# Patient Record
Sex: Female | Born: 1965 | Race: White | Hispanic: No | Smoking: Never smoker
Health system: Southern US, Community
[De-identification: ages and names within clinical notes are randomized; demographics above are authoritative.]

## PROBLEM LIST (undated history)

## (undated) DIAGNOSIS — E0590 Thyrotoxicosis, unspecified without thyrotoxic crisis or storm: Secondary | ICD-10-CM

## (undated) DIAGNOSIS — E079 Disorder of thyroid, unspecified: Secondary | ICD-10-CM

## (undated) DIAGNOSIS — Z8719 Personal history of other diseases of the digestive system: Secondary | ICD-10-CM

## (undated) DIAGNOSIS — I499 Cardiac arrhythmia, unspecified: Secondary | ICD-10-CM

## (undated) DIAGNOSIS — K219 Gastro-esophageal reflux disease without esophagitis: Secondary | ICD-10-CM

## (undated) HISTORY — DX: Personal history of other diseases of the digestive system: Z87.19

## (undated) HISTORY — DX: Disorder of thyroid, unspecified: E07.9

## (undated) HISTORY — DX: Gastro-esophageal reflux disease without esophagitis: K21.9

---

## 2003-12-16 DIAGNOSIS — E0590 Thyrotoxicosis, unspecified without thyrotoxic crisis or storm: Secondary | ICD-10-CM

## 2003-12-16 HISTORY — DX: Thyrotoxicosis, unspecified without thyrotoxic crisis or storm: E05.90

## 2008-11-23 ENCOUNTER — Other Ambulatory Visit: Admission: RE | Admit: 2008-11-23 | Discharge: 2008-11-23 | Payer: Self-pay | Admitting: Family Medicine

## 2009-05-15 ENCOUNTER — Ambulatory Visit (HOSPITAL_COMMUNITY): Admission: RE | Admit: 2009-05-15 | Discharge: 2009-05-15 | Payer: Self-pay | Admitting: Family Medicine

## 2011-11-24 ENCOUNTER — Other Ambulatory Visit (HOSPITAL_COMMUNITY): Payer: Self-pay | Admitting: Family Medicine

## 2011-11-24 DIAGNOSIS — Z1231 Encounter for screening mammogram for malignant neoplasm of breast: Secondary | ICD-10-CM

## 2012-01-01 ENCOUNTER — Ambulatory Visit (HOSPITAL_COMMUNITY): Payer: Self-pay

## 2012-01-16 ENCOUNTER — Ambulatory Visit (HOSPITAL_COMMUNITY)
Admission: RE | Admit: 2012-01-16 | Discharge: 2012-01-16 | Disposition: A | Discharge status: Home / Self Care | Payer: PRIVATE HEALTH INSURANCE | Source: Ambulatory Visit | Attending: Family Medicine | Admitting: Family Medicine

## 2012-01-16 DIAGNOSIS — Z1231 Encounter for screening mammogram for malignant neoplasm of breast: Secondary | ICD-10-CM | POA: Insufficient documentation

## 2012-02-12 ENCOUNTER — Other Ambulatory Visit (HOSPITAL_COMMUNITY)
Admission: RE | Admit: 2012-02-12 | Discharge: 2012-02-12 | Disposition: A | Discharge status: Home / Self Care | Payer: PRIVATE HEALTH INSURANCE | Source: Ambulatory Visit | Attending: Obstetrics and Gynecology | Admitting: Obstetrics and Gynecology

## 2012-02-12 ENCOUNTER — Other Ambulatory Visit: Payer: Self-pay | Admitting: Nurse Practitioner

## 2012-02-12 DIAGNOSIS — Z1159 Encounter for screening for other viral diseases: Secondary | ICD-10-CM | POA: Insufficient documentation

## 2012-02-12 DIAGNOSIS — Z01419 Encounter for gynecological examination (general) (routine) without abnormal findings: Secondary | ICD-10-CM | POA: Insufficient documentation

## 2013-03-22 ENCOUNTER — Other Ambulatory Visit (HOSPITAL_COMMUNITY): Payer: Self-pay | Admitting: Family Medicine

## 2013-03-22 DIAGNOSIS — Z1231 Encounter for screening mammogram for malignant neoplasm of breast: Secondary | ICD-10-CM

## 2013-03-31 ENCOUNTER — Ambulatory Visit (HOSPITAL_COMMUNITY)
Admission: RE | Admit: 2013-03-31 | Discharge: 2013-03-31 | Disposition: A | Discharge status: Home / Self Care | Payer: PRIVATE HEALTH INSURANCE | Source: Ambulatory Visit | Attending: Family Medicine | Admitting: Family Medicine

## 2013-03-31 DIAGNOSIS — Z1231 Encounter for screening mammogram for malignant neoplasm of breast: Secondary | ICD-10-CM | POA: Insufficient documentation

## 2013-12-09 ENCOUNTER — Other Ambulatory Visit (HOSPITAL_COMMUNITY)
Admission: RE | Admit: 2013-12-09 | Discharge: 2013-12-09 | Disposition: A | Discharge status: Home / Self Care | Payer: PRIVATE HEALTH INSURANCE | Source: Ambulatory Visit | Attending: Obstetrics & Gynecology | Admitting: Obstetrics & Gynecology

## 2013-12-09 ENCOUNTER — Other Ambulatory Visit: Payer: Self-pay | Admitting: Obstetrics & Gynecology

## 2013-12-09 DIAGNOSIS — Z01419 Encounter for gynecological examination (general) (routine) without abnormal findings: Secondary | ICD-10-CM | POA: Insufficient documentation

## 2015-07-04 ENCOUNTER — Other Ambulatory Visit (HOSPITAL_COMMUNITY): Payer: Self-pay | Admitting: Family Medicine

## 2015-07-04 DIAGNOSIS — Z1231 Encounter for screening mammogram for malignant neoplasm of breast: Secondary | ICD-10-CM

## 2015-07-12 ENCOUNTER — Ambulatory Visit (HOSPITAL_COMMUNITY)
Admission: RE | Admit: 2015-07-12 | Discharge: 2015-07-12 | Disposition: A | Discharge status: Home / Self Care | Payer: BLUE CROSS/BLUE SHIELD | Source: Ambulatory Visit | Attending: Family Medicine | Admitting: Family Medicine

## 2015-07-12 DIAGNOSIS — Z1231 Encounter for screening mammogram for malignant neoplasm of breast: Secondary | ICD-10-CM | POA: Diagnosis not present

## 2015-07-19 ENCOUNTER — Other Ambulatory Visit: Payer: Self-pay | Admitting: Obstetrics & Gynecology

## 2015-09-13 ENCOUNTER — Encounter (INDEPENDENT_AMBULATORY_CARE_PROVIDER_SITE_OTHER): Payer: Self-pay

## 2015-09-13 ENCOUNTER — Ambulatory Visit: Payer: PRIVATE HEALTH INSURANCE | Admitting: Internal Medicine

## 2015-09-13 ENCOUNTER — Ambulatory Visit (INDEPENDENT_AMBULATORY_CARE_PROVIDER_SITE_OTHER): Payer: BLUE CROSS/BLUE SHIELD | Admitting: Internal Medicine

## 2015-09-13 ENCOUNTER — Encounter: Payer: Self-pay | Admitting: Internal Medicine

## 2015-09-13 VITALS — BP 124/68 | HR 68 | Temp 98.1°F | Ht 65.33 in | Wt 166.0 lb

## 2015-09-13 DIAGNOSIS — K219 Gastro-esophageal reflux disease without esophagitis: Secondary | ICD-10-CM

## 2015-09-13 DIAGNOSIS — R21 Rash and other nonspecific skin eruption: Secondary | ICD-10-CM

## 2015-09-13 DIAGNOSIS — R002 Palpitations: Secondary | ICD-10-CM | POA: Diagnosis not present

## 2015-09-13 DIAGNOSIS — Z8639 Personal history of other endocrine, nutritional and metabolic disease: Secondary | ICD-10-CM | POA: Insufficient documentation

## 2015-09-13 NOTE — Assessment & Plan Note (Signed)
No intervention needed Will continue to monitor

## 2015-09-13 NOTE — Progress Notes (Signed)
HPI  Pt presents to the clinic today to establish care and for management of the conditions listed below.  Skin rashes: Takes Zyrtec daily to prevent this.  GERD: This is occuring more frequently, almost daily. It is triggered by fried foods. She will take Tums if it is really severe. She does have a hiatal hernia.  Hyperthyroid: She went into a thyroid storm in 2005. She was on PTU for a year. She has not had any recurrent issues.  She does feel like she is having PVC's. This has been going on for the last month. She is not sure if it is related to stress. She reports she is going through a separation. She denies anxiety or depression. She has been on Metoprolol in the past when she had hyperthyroidism.  Flu: 08/2014 Tetanus: within the last 10 years Pap Smear: 2014 Mammogram: 06/2015 Colonoscopy: never Vision Screening: yearly Dentist: yearly 06/2015 Past Medical History  Diagnosis Date  . GERD (gastroesophageal reflux disease)   . H/O hiatal hernia   . Thyroid disease     Dx 2005    Current Outpatient Prescriptions  Medication Sig Dispense Refill  . cetirizine (ZYRTEC) 10 MG tablet Take 10 mg by mouth daily.     No current facility-administered medications for this visit.    No Known Allergies  Family History  Problem Relation Age of Onset  . Breast cancer Mother   . Kidney cancer Maternal Grandmother   . Heart disease Maternal Grandfather   . Alzheimer's disease Maternal Grandfather     Social History   Social History  . Marital Status: Married    Spouse Name: N/A  . Number of Children: N/A  . Years of Education: N/A   Occupational History  . Not on file.   Social History Main Topics  . Smoking status: Never Smoker   . Smokeless tobacco: Never Used  . Alcohol Use: 0.0 oz/week    0 Standard drinks or equivalent per week     Comment: occasional wine  . Drug Use: Not on file  . Sexual Activity: Not on file   Other Topics Concern  . Not on file   Social  History Narrative  . No narrative on file    ROS:  Constitutional: Denies fever, malaise, fatigue, headache or abrupt weight changes.  HEENT: Denies eye pain, eye redness, ear pain, ringing in the ears, wax buildup, runny nose, nasal congestion, bloody nose, or sore throat. Respiratory: Denies difficulty breathing, shortness of breath, cough or sputum production.   Cardiovascular: Pt reports palpitations. Denies chest pain, chest tightness, or swelling in the hands or feet.  Skin: Denies redness, rashes, lesions or ulcercations.  Neurological: Denies dizziness, difficulty with memory, difficulty with speech or problems with balance and coordination.  Psych: Denies anxiety, depression, SI/HI.  No other specific complaints in a complete review of systems (except as listed in HPI above).  PE:  BP 124/68 mmHg  Pulse 68  Temp(Src) 98.1 F (36.7 C) (Oral)  Ht 5' 5.33" (1.659 m)  Wt 166 lb (75.297 kg)  BMI 27.36 kg/m2  SpO2 98%  LMP 08/21/2015 Wt Readings from Last 3 Encounters:  09/13/15 166 lb (75.297 kg)    General: Appears her stated age, well developed, well nourished in NAD. Neck: Neck supple, trachea midline. No masses, lumps or thyromegaly present.  Cardiovascular: Normal rate with irregular rhythm. No murmur, rubs or gallops noted.  Pulmonary/Chest: Normal effort and positive vesicular breath sounds. No respiratory distress. No wheezes, rales  or ronchi noted.  Neurological: Alert and oriented.  Psychiatric: Mood and affect normal. Behavior is normal. Judgment and thought content normal.     Assessment and Plan:  Palpitations:  ECG today- Will check CMET and TSH today as well  Make an appt for your annual exam

## 2015-09-13 NOTE — Progress Notes (Signed)
Pre visit review using our clinic review tool, if applicable. No additional management support is needed unless otherwise documented below in the visit note. 

## 2015-09-13 NOTE — Patient Instructions (Signed)

## 2015-09-13 NOTE — Assessment & Plan Note (Signed)
Continue Zyrtec daily 

## 2015-09-13 NOTE — Assessment & Plan Note (Signed)
Discussed trying Prilosec- she has tried in the past and it caused diarrhea She wants to see if this improves with less stress Continue Tums OTC

## 2015-09-14 LAB — CBC
HCT: 42.5 % (ref 36.0–46.0)
Hemoglobin: 14.4 g/dL (ref 12.0–15.0)
MCHC: 33.9 g/dL (ref 30.0–36.0)
MCV: 96.9 fl (ref 78.0–100.0)
Platelets: 219 K/uL (ref 150.0–400.0)
RBC: 4.38 Mil/uL (ref 3.87–5.11)
RDW: 12.6 % (ref 11.5–15.5)
WBC: 8.6 K/uL (ref 4.0–10.5)

## 2015-09-14 LAB — COMPREHENSIVE METABOLIC PANEL
ALK PHOS: 56 U/L (ref 39–117)
ALT: 11 U/L (ref 0–35)
AST: 14 U/L (ref 0–37)
Albumin: 4.2 g/dL (ref 3.5–5.2)
BUN: 12 mg/dL (ref 6–23)
CO2: 29 meq/L (ref 19–32)
Calcium: 9.6 mg/dL (ref 8.4–10.5)
Chloride: 105 mEq/L (ref 96–112)
Creatinine, Ser: 0.77 mg/dL (ref 0.40–1.20)
GFR: 84.31 mL/min (ref >60.00)
GLUCOSE: 73 mg/dL (ref 70–99)
POTASSIUM: 4.2 meq/L (ref 3.5–5.1)
Sodium: 140 mEq/L (ref 135–145)
Total Bilirubin: 0.3 mg/dL (ref 0.2–1.2)
Total Protein: 6.9 g/dL (ref 6.0–8.3)

## 2015-09-14 LAB — TSH: TSH: 0.91 u[IU]/mL (ref 0.35–4.50)

## 2015-11-14 ENCOUNTER — Encounter: Payer: Self-pay | Admitting: Internal Medicine

## 2015-12-19 NOTE — H&P (Signed)
50yo G3P3 who presents for follow up regarding AUB. Pt has called in due to irregular bleeding that has continued with Mirena, TVUS peformed for further assessment of AUB and to check proper location of device. IUD was checked in office and string visualized at proper location. Since placement of devices bleeding is sporadic, she may have a full period for 7-10 days then no bleeding, then light spotting for a week, it constantly varies. In review, the device was placed for AUB as she reported that over the past several months her periods have become heavier. She had reported 5-6 days of "very heavy" bleeding using a super pad and having to change it every 2-3hours and her menses may last for up to two weeks. Following placement, she had a week of a "full blown" period and then has had light spotting requiring a pantyliner since that time. Other than the spotting, she denies pelvic or abdominal pain. No further episodes of heavy bleeding. No other acute complaints.  Work up included the following: EMB performed (8/4)- negative for hyperplasia or malignancy.  Current Medications  Taking   Cetirizine HCl 10 MG Tablet 1 tablet Once a day   Triamcinolone Acetonide 0.1 % Ointment 1 application sparingly to affected area Twice a day   Benadryl(DiphenhydrAMINE HCl) 25 MG Capsule 1 capsule every 6 hrs, Notes: prn   Mirena(Levonorgestrel) , Notes: 08/10/2015   Omeprazole 20 MG Capsule Delayed Release 1 capsule Once a day   Medication List reviewed and reconciled with the patient    Past Medical History  Hyperthyroidism  Esophageal reflux  Dermatitis   Surgical History  No Surgical History documented.   Family History  Father: alive, hypercholesterolemia hypercholesterolemia  Mother: alive, hypercholesterolemia, cancer, breast hypercholesterolemia, cancer, breast, diagnosed with Breast Ca  Maternal Grand Father: deceased  Maternal Grand Mother: deceased, cancer, renal cell cancer, renal cell  Sister  1: alive, hypercholesterolemia hypercholesterolemia   Social History  General:  Tobacco use  cigarettes: Never smoked Tobacco history last updated 09/13/2015 Alcohol: wine, 1-2 per week.  no Recreational drug use.  Exercise: 3-4 x a week.  Marital Status: married.  Children: 3.  Religion: yes, [employer].  Seat belt use: yes.    Gyn History  Sexual activity currently sexually active.  Periods : irregular.  LMP Irregular bleeding throughout Nov 2016. Currently not bleeding for at least 5 days.  Birth control vasectomy, Mirena IUD.  Last pap smear date 12/09/13, negative.  Last mammogram date 07/12/15.  Abnormal pap smear 2000 - normal follow up.  STD GC/Ch 1985.  Denies H/O GYN procedures.    OB History  Pregnancy # 1 live birth, vaginal delivery.  Pregnancy # 2 live birth, vaginal delivery.  Pregnancy # 3 live birth, vaginal delivery.    Allergies  N.K.D.A.   Hospitalization/Major Diagnostic Procedure  No Hospitalization History.   Review of Systems  CONSTITUTIONAL:  no Appetite changes. no Chills. no Fatigue. no Fever.  CARDIOLOGY:  no Chest pain.  RESPIRATORY:  no Shortness of breath. no Cough.  UROLOGY:  no Dysuria. no Urinary frequency. no Urinary incontinence. no Urinary urgency.  GASTROENTEROLOGY:  no Abdominal pain. no Change in bowel habits. no Change in bowel movements.  FEMALE REPRODUCTIVE:  no Abnormal vaginal discharge. no Breast lumps or discharge. no Breast pain. no Hot flashes. no Sexual problems. no Vaginal irritation. no Vaginal itching.  NEUROLOGY:  no Dizziness. no Headache.  PSYCHOLOGY:  no Anxiety. no Depression.  SKIN:  no Rash. no Hives.  HEMATOLOGY/LYMPH:  no Anemia. Using Blood Thinners no.     O: performed in office  GENERAL APPEARANCE well developed, well nourished.  SKIN: warm and dry, no rashes.  LUNGS: regular breathing rate and effort.  FEMALE GENITOURINARY: normal external genitalia, labia - unremarkable, vagina - pink moist  mucosa, no lesions or abnormal discharge, cervix - no discharge or lesions or CMT, strings visualized at cervical os-retroverted uterus.  EXTREMITIES: no edema present.  PSYCH appropriate mood and affect.     Assessments      Labs:  11/22/2015: [place] report reviewed: 11cm retroverted uterus with mulitple fibroids- all submucosal- largest measuring 3.6cm. IUD in proper location. Right ovary with 3.5cm cyst- thin septations-avascular. Left ovary with 5cm cyst with thin septations. No free fluid.   A/P: 50yo G3P3 who presents for LAVH, BS due to abnormal uterine bleeding and ovarian cyst. -NPO -LR @ 125cc/hr -SCDs to OR -Ancef 2g IV to OR -CBC/BMP prior to surgery -Discussed risk/benefit/indications and alternatives including but not limited to risk of bleeding, infection and injury to surrounding organs.  Questions and concerns were addressed and pt wishes to proceed. Reviewed preservation of ovaries if possible.  [name], DO [phone] (pager) [phone] (office)

## 2015-12-26 NOTE — Patient Instructions (Signed)
Your procedure is scheduled on:  Wednesday, Jan. 18, 2017  Enter through the [employer] [facility] at:  9:00 A.M.  Pick up the phone at the desk and dial [phone].  Call this number if you have problems the morning of surgery: [phone].  Remember: Do NOT eat food or drink after:  Midnight Tuesday Take these medicines the morning of surgery with a SIP OF WATER:  Omeprazole  Do NOT wear jewelry (body piercing), metal hair clips/bobby pins, make-up, or nail polish. Do NOT wear lotions, powders, or perfumes.  You may wear deoderant. Do NOT shave for 48 hours prior to surgery. Do NOT bring valuables to the hospital. Contacts, dentures, or bridgework may not be worn into surgery. Leave suitcase in car.  After surgery it may be brought to your room.  For patients admitted to the hospital, checkout time is 11:00 AM the day of discharge.

## 2015-12-27 ENCOUNTER — Encounter (HOSPITAL_COMMUNITY)
Admission: RE | Admit: 2015-12-27 | Discharge: 2015-12-27 | Disposition: A | Discharge status: Home / Self Care | Payer: BLUE CROSS/BLUE SHIELD | Source: Ambulatory Visit | Attending: Obstetrics & Gynecology | Admitting: Obstetrics & Gynecology

## 2015-12-27 ENCOUNTER — Encounter (HOSPITAL_COMMUNITY): Payer: Self-pay

## 2015-12-27 DIAGNOSIS — N259 Disorder resulting from impaired renal tubular function, unspecified: Secondary | ICD-10-CM | POA: Insufficient documentation

## 2015-12-27 DIAGNOSIS — N939 Abnormal uterine and vaginal bleeding, unspecified: Secondary | ICD-10-CM | POA: Insufficient documentation

## 2015-12-27 DIAGNOSIS — Z01812 Encounter for preprocedural laboratory examination: Secondary | ICD-10-CM | POA: Insufficient documentation

## 2015-12-27 DIAGNOSIS — N83209 Unspecified ovarian cyst, unspecified side: Secondary | ICD-10-CM | POA: Diagnosis not present

## 2015-12-27 HISTORY — DX: Cardiac arrhythmia, unspecified: I49.9

## 2015-12-27 HISTORY — DX: Thyrotoxicosis, unspecified without thyrotoxic crisis or storm: E05.90

## 2015-12-27 LAB — COMPREHENSIVE METABOLIC PANEL
ALBUMIN: 4.7 g/dL (ref 3.5–5.0)
ALT: 21 U/L (ref 14–54)
AST: 21 U/L (ref 15–41)
Alkaline Phosphatase: 60 U/L (ref 38–126)
Anion gap: 7 (ref 5–15)
BILIRUBIN TOTAL: 0.8 mg/dL (ref 0.3–1.2)
BUN: 11 mg/dL (ref 6–20)
CO2: 28 mmol/L (ref 22–32)
CREATININE: 0.7 mg/dL (ref 0.44–1.00)
Calcium: 9.7 mg/dL (ref 8.9–10.3)
Chloride: 103 mmol/L (ref 101–111)
GFR calc Af Amer: >60 mL/min (ref >60)
GLUCOSE: 104 mg/dL — AB (ref 65–99)
Potassium: 3.9 mmol/L (ref 3.5–5.1)
Sodium: 138 mmol/L (ref 135–145)
TOTAL PROTEIN: 7.7 g/dL (ref 6.5–8.1)

## 2015-12-27 LAB — CBC
HEMATOCRIT: 41.6 % (ref 36.0–46.0)
HEMOGLOBIN: 14.9 g/dL (ref 12.0–15.0)
MCH: 32.7 pg (ref 26.0–34.0)
MCHC: 35.8 g/dL (ref 30.0–36.0)
MCV: 91.2 fL (ref 78.0–100.0)
Platelets: 266 10*3/uL (ref 150–400)
RBC: 4.56 MIL/uL (ref 3.87–5.11)
RDW: 12 % (ref 11.5–15.5)
WBC: 4.9 10*3/uL (ref 4.0–10.5)

## 2015-12-28 ENCOUNTER — Telehealth: Payer: Self-pay

## 2015-12-28 NOTE — Telephone Encounter (Signed)
Pt is aware.  

## 2015-12-28 NOTE — Telephone Encounter (Signed)
She does not need medical clearance. Ok to proceed with hysterectomy.

## 2015-12-28 NOTE — Telephone Encounter (Signed)
Pt left v/m; pt is scheduled for Hysterectomy and pt was advised by surgeon's office to ck with PCP whether or not she needs medical clearance for surgery. Pt request cb. Last seen 09/13/15 to establish care.Please advise.

## 2016-01-02 ENCOUNTER — Observation Stay (HOSPITAL_COMMUNITY)
Admission: RE | Admit: 2016-01-02 | Discharge: 2016-01-03 | Disposition: A | Discharge status: Home / Self Care | Payer: BLUE CROSS/BLUE SHIELD | Source: Ambulatory Visit | Attending: Obstetrics & Gynecology | Admitting: Obstetrics & Gynecology

## 2016-01-02 ENCOUNTER — Ambulatory Visit (HOSPITAL_COMMUNITY): Payer: BLUE CROSS/BLUE SHIELD | Admitting: Anesthesiology

## 2016-01-02 ENCOUNTER — Encounter (HOSPITAL_COMMUNITY)
Admission: RE | Disposition: A | Discharge status: Home / Self Care | Payer: Self-pay | Source: Ambulatory Visit | Attending: Obstetrics & Gynecology

## 2016-01-02 ENCOUNTER — Encounter (HOSPITAL_COMMUNITY): Payer: Self-pay | Admitting: Anesthesiology

## 2016-01-02 DIAGNOSIS — D259 Leiomyoma of uterus, unspecified: Secondary | ICD-10-CM | POA: Diagnosis not present

## 2016-01-02 DIAGNOSIS — E0590 Thyrotoxicosis, unspecified without thyrotoxic crisis or storm: Secondary | ICD-10-CM | POA: Diagnosis not present

## 2016-01-02 DIAGNOSIS — N938 Other specified abnormal uterine and vaginal bleeding: Secondary | ICD-10-CM | POA: Insufficient documentation

## 2016-01-02 DIAGNOSIS — N838 Other noninflammatory disorders of ovary, fallopian tube and broad ligament: Secondary | ICD-10-CM | POA: Insufficient documentation

## 2016-01-02 DIAGNOSIS — D270 Benign neoplasm of right ovary: Secondary | ICD-10-CM | POA: Insufficient documentation

## 2016-01-02 DIAGNOSIS — K219 Gastro-esophageal reflux disease without esophagitis: Secondary | ICD-10-CM | POA: Diagnosis not present

## 2016-01-02 DIAGNOSIS — Z79899 Other long term (current) drug therapy: Secondary | ICD-10-CM | POA: Insufficient documentation

## 2016-01-02 HISTORY — PX: BILATERAL SALPINGECTOMY: SHX5743

## 2016-01-02 HISTORY — PX: IUD REMOVAL: SHX5392

## 2016-01-02 HISTORY — PX: LAPAROSCOPIC VAGINAL HYSTERECTOMY WITH SALPINGECTOMY: SHX6680

## 2016-01-02 LAB — TYPE AND SCREEN
ABO/RH(D): O POS
Antibody Screen: NEGATIVE

## 2016-01-02 LAB — ABO/RH: ABO/RH(D): O POS

## 2016-01-02 SURGERY — HYSTERECTOMY, VAGINAL, LAPAROSCOPY-ASSISTED, WITH SALPINGECTOMY
Anesthesia: General | Site: Vagina

## 2016-01-02 MED ORDER — FENTANYL CITRATE (PF) 250 MCG/5ML IJ SOLN
INTRAMUSCULAR | Status: AC
  Filled 2016-01-02: qty 5

## 2016-01-02 MED ORDER — OXYCODONE-ACETAMINOPHEN 5-325 MG PO TABS: 1.0000 | ORAL_TABLET | ORAL | Status: DC | PRN

## 2016-01-02 MED ORDER — KETOROLAC TROMETHAMINE 30 MG/ML IJ SOLN: 30.0000 mg | Freq: Four times a day (QID) | INTRAMUSCULAR | Status: DC

## 2016-01-02 MED ORDER — ROCURONIUM BROMIDE 100 MG/10ML IV SOLN
INTRAVENOUS | Status: DC | PRN
  Administered 2016-01-02: 30 mg via INTRAVENOUS
  Administered 2016-01-02: 20 mg via INTRAVENOUS

## 2016-01-02 MED ORDER — KETOROLAC TROMETHAMINE 30 MG/ML IJ SOLN
30.0000 mg | Freq: Four times a day (QID) | INTRAMUSCULAR | Status: DC
  Administered 2016-01-02 – 2016-01-03 (×3): 30 mg via INTRAVENOUS
  Filled 2016-01-02 (×3): qty 1

## 2016-01-02 MED ORDER — ONDANSETRON HCL 4 MG/2ML IJ SOLN: 4.0000 mg | Freq: Four times a day (QID) | INTRAMUSCULAR | Status: DC | PRN

## 2016-01-02 MED ORDER — BUPIVACAINE HCL (PF) 0.25 % IJ SOLN
INTRAMUSCULAR | Status: DC | PRN
  Administered 2016-01-02: 29 mL

## 2016-01-02 MED ORDER — BUPIVACAINE HCL (PF) 0.25 % IJ SOLN
INTRAMUSCULAR | Status: AC
  Filled 2016-01-02: qty 30

## 2016-01-02 MED ORDER — LACTATED RINGERS IV SOLN
INTRAVENOUS | Status: DC
  Administered 2016-01-02: 09:00:00 via INTRAVENOUS

## 2016-01-02 MED ORDER — MIDAZOLAM HCL 2 MG/2ML IJ SOLN
INTRAMUSCULAR | Status: AC
  Filled 2016-01-02: qty 2

## 2016-01-02 MED ORDER — MENTHOL 3 MG MT LOZG: 1.0000 | LOZENGE | OROMUCOSAL | Status: DC | PRN

## 2016-01-02 MED ORDER — ONDANSETRON HCL 4 MG/2ML IJ SOLN
INTRAMUSCULAR | Status: DC | PRN
  Administered 2016-01-02: 4 mg via INTRAVENOUS

## 2016-01-02 MED ORDER — CEFAZOLIN SODIUM-DEXTROSE 2-3 GM-% IV SOLR
INTRAVENOUS | Status: AC
  Filled 2016-01-02: qty 50

## 2016-01-02 MED ORDER — GLYCOPYRROLATE 0.2 MG/ML IJ SOLN
INTRAMUSCULAR | Status: DC | PRN
  Administered 2016-01-02 (×2): 0.1 mg via INTRAVENOUS
  Administered 2016-01-02: 0.4 mg via INTRAVENOUS

## 2016-01-02 MED ORDER — LIDOCAINE-EPINEPHRINE (PF) 1 %-1:200000 IJ SOLN
INTRAMUSCULAR | Status: DC | PRN
  Administered 2016-01-02: 15 mL

## 2016-01-02 MED ORDER — SCOPOLAMINE 1 MG/3DAYS TD PT72
1.0000 | MEDICATED_PATCH | Freq: Once | TRANSDERMAL | Status: DC
  Administered 2016-01-02: 1.5 mg via TRANSDERMAL

## 2016-01-02 MED ORDER — DOCUSATE SODIUM 100 MG PO CAPS
100.0000 mg | ORAL_CAPSULE | Freq: Two times a day (BID) | ORAL | Status: DC
  Administered 2016-01-02 – 2016-01-03 (×2): 100 mg via ORAL
  Filled 2016-01-02 (×2): qty 1

## 2016-01-02 MED ORDER — LACTATED RINGERS IV SOLN
INTRAVENOUS | Status: DC
  Administered 2016-01-02 – 2016-01-03 (×2): via INTRAVENOUS

## 2016-01-02 MED ORDER — DEXAMETHASONE SODIUM PHOSPHATE 4 MG/ML IJ SOLN
INTRAMUSCULAR | Status: AC
  Filled 2016-01-02: qty 1

## 2016-01-02 MED ORDER — GLYCOPYRROLATE 0.2 MG/ML IJ SOLN: INTRAMUSCULAR | Status: DC | PRN

## 2016-01-02 MED ORDER — PROPOFOL 10 MG/ML IV BOLUS
INTRAVENOUS | Status: DC | PRN
  Administered 2016-01-02: 180 mg via INTRAVENOUS

## 2016-01-02 MED ORDER — POLYETHYLENE GLYCOL 3350 17 G PO PACK: 17.0000 g | PACK | Freq: Every day | ORAL | Status: DC | PRN

## 2016-01-02 MED ORDER — FENTANYL CITRATE (PF) 100 MCG/2ML IJ SOLN
INTRAMUSCULAR | Status: DC | PRN
  Administered 2016-01-02 (×2): 100 ug via INTRAVENOUS

## 2016-01-02 MED ORDER — MIDAZOLAM HCL 2 MG/2ML IJ SOLN
INTRAMUSCULAR | Status: DC | PRN
  Administered 2016-01-02: 2 mg via INTRAVENOUS

## 2016-01-02 MED ORDER — EPHEDRINE SULFATE 50 MG/ML IJ SOLN
INTRAMUSCULAR | Status: DC | PRN
  Administered 2016-01-02: 5 mg via INTRAVENOUS

## 2016-01-02 MED ORDER — LIDOCAINE-EPINEPHRINE (PF) 1 %-1:200000 IJ SOLN
INTRAMUSCULAR | Status: AC
  Filled 2016-01-02: qty 30

## 2016-01-02 MED ORDER — PROMETHAZINE HCL 25 MG/ML IJ SOLN: 6.2500 mg | INTRAMUSCULAR | Status: DC | PRN

## 2016-01-02 MED ORDER — LIDOCAINE HCL (CARDIAC) 20 MG/ML IV SOLN
INTRAVENOUS | Status: AC
  Filled 2016-01-02: qty 5

## 2016-01-02 MED ORDER — EPHEDRINE 5 MG/ML INJ
INTRAVENOUS | Status: AC
  Filled 2016-01-02: qty 10

## 2016-01-02 MED ORDER — MEPERIDINE HCL 25 MG/ML IJ SOLN: 6.2500 mg | INTRAMUSCULAR | Status: DC | PRN

## 2016-01-02 MED ORDER — KETOROLAC TROMETHAMINE 30 MG/ML IJ SOLN
INTRAMUSCULAR | Status: DC | PRN
  Administered 2016-01-02: 30 mg via INTRAVENOUS

## 2016-01-02 MED ORDER — HYDROMORPHONE HCL 1 MG/ML IJ SOLN: 0.2500 mg | INTRAMUSCULAR | Status: DC | PRN

## 2016-01-02 MED ORDER — LIDOCAINE HCL (CARDIAC) 20 MG/ML IV SOLN
INTRAVENOUS | Status: DC | PRN
  Administered 2016-01-02: 100 mg via INTRAVENOUS

## 2016-01-02 MED ORDER — KETOROLAC TROMETHAMINE 30 MG/ML IJ SOLN: 30.0000 mg | Freq: Once | INTRAMUSCULAR | Status: DC

## 2016-01-02 MED ORDER — NEOSTIGMINE METHYLSULFATE 10 MG/10ML IV SOLN
INTRAVENOUS | Status: AC
  Filled 2016-01-02: qty 1

## 2016-01-02 MED ORDER — CEFAZOLIN SODIUM-DEXTROSE 2-3 GM-% IV SOLR
2.0000 g | INTRAVENOUS | Status: AC
  Administered 2016-01-02: 2 g via INTRAVENOUS

## 2016-01-02 MED ORDER — LACTATED RINGERS IR SOLN
Status: DC | PRN
  Administered 2016-01-02: 3000 mL

## 2016-01-02 MED ORDER — SIMETHICONE 80 MG PO CHEW: 80.0000 mg | CHEWABLE_TABLET | Freq: Four times a day (QID) | ORAL | Status: DC | PRN

## 2016-01-02 MED ORDER — PROPOFOL 10 MG/ML IV BOLUS
INTRAVENOUS | Status: AC
  Filled 2016-01-02: qty 20

## 2016-01-02 MED ORDER — PANTOPRAZOLE SODIUM 40 MG PO TBEC
40.0000 mg | DELAYED_RELEASE_TABLET | Freq: Every day | ORAL | Status: DC
  Administered 2016-01-03: 40 mg via ORAL
  Filled 2016-01-02: qty 1

## 2016-01-02 MED ORDER — ROCURONIUM BROMIDE 100 MG/10ML IV SOLN
INTRAVENOUS | Status: AC
  Filled 2016-01-02: qty 1

## 2016-01-02 MED ORDER — LACTATED RINGERS IV SOLN
INTRAVENOUS | Status: DC
  Administered 2016-01-02 (×2): via INTRAVENOUS

## 2016-01-02 MED ORDER — SCOPOLAMINE 1 MG/3DAYS TD PT72
MEDICATED_PATCH | TRANSDERMAL | Status: AC
  Administered 2016-01-02: 1.5 mg via TRANSDERMAL
  Filled 2016-01-02: qty 1

## 2016-01-02 MED ORDER — KETOROLAC TROMETHAMINE 30 MG/ML IJ SOLN
INTRAMUSCULAR | Status: AC
  Filled 2016-01-02: qty 1

## 2016-01-02 MED ORDER — GLYCOPYRROLATE 0.2 MG/ML IJ SOLN
INTRAMUSCULAR | Status: AC
  Filled 2016-01-02: qty 3

## 2016-01-02 MED ORDER — NEOSTIGMINE METHYLSULFATE 10 MG/10ML IV SOLN
INTRAVENOUS | Status: DC | PRN
  Administered 2016-01-02: 3 mg via INTRAVENOUS

## 2016-01-02 MED ORDER — DEXAMETHASONE SODIUM PHOSPHATE 10 MG/ML IJ SOLN
INTRAMUSCULAR | Status: DC | PRN
  Administered 2016-01-02: 4 mg via INTRAVENOUS

## 2016-01-02 MED ORDER — ONDANSETRON HCL 4 MG PO TABS: 4.0000 mg | ORAL_TABLET | Freq: Four times a day (QID) | ORAL | Status: DC | PRN

## 2016-01-02 MED ORDER — ONDANSETRON HCL 4 MG/2ML IJ SOLN
INTRAMUSCULAR | Status: AC
  Filled 2016-01-02: qty 2

## 2016-01-02 SURGICAL SUPPLY — 41 items
APPLICATOR ARISTA FLEXITIP XL (MISCELLANEOUS) IMPLANT
CABLE HIGH FREQUENCY MONO STRZ (ELECTRODE) IMPLANT
CLOTH BEACON ORANGE TIMEOUT ST (SAFETY) ×3 IMPLANT
DRSG COVADERM PLUS 2X2 (GAUZE/BANDAGES/DRESSINGS) IMPLANT
DRSG OPSITE POSTOP 3X4 (GAUZE/BANDAGES/DRESSINGS) ×3 IMPLANT
DURAPREP 26ML APPLICATOR (WOUND CARE) ×3 IMPLANT
ELECT LIGASURE SHORT 9 REUSE (ELECTRODE) ×3 IMPLANT
GAUZE PACKING IODOFORM 2 (PACKING) ×3 IMPLANT
GLOVE BIOGEL PI IND STRL 6.5 (GLOVE) ×8 IMPLANT
GLOVE BIOGEL PI IND STRL 7.0 (GLOVE) ×4 IMPLANT
GLOVE BIOGEL PI INDICATOR 6.5 (GLOVE) ×4
GLOVE BIOGEL PI INDICATOR 7.0 (GLOVE) ×2
GLOVE ECLIPSE 6.5 STRL STRAW (GLOVE) ×6 IMPLANT
GOWN STRL REUS W/TWL LRG LVL3 (GOWN DISPOSABLE) ×6 IMPLANT
LEGGING LITHOTOMY PAIR STRL (DRAPES) ×3 IMPLANT
LIQUID BAND (GAUZE/BANDAGES/DRESSINGS) ×3 IMPLANT
NEEDLE INSUFFLATION 120MM (ENDOMECHANICALS) ×3 IMPLANT
PACK LAPAROSCOPY BASIN (CUSTOM PROCEDURE TRAY) ×3 IMPLANT
PACK LAVH (CUSTOM PROCEDURE TRAY) ×3 IMPLANT
PACK ROBOTIC GOWN (GOWN DISPOSABLE) ×3 IMPLANT
PAD OB MATERNITY 4.3X12.25 (PERSONAL CARE ITEMS) ×3 IMPLANT
PAD TRENDELENBURG POSITION (MISCELLANEOUS) ×3 IMPLANT
POUCH SPECIMEN RETRIEVAL 10MM (ENDOMECHANICALS) IMPLANT
SCISSORS LAP 5X35 DISP (ENDOMECHANICALS) IMPLANT
SET IRRIG TUBING LAPAROSCOPIC (IRRIGATION / IRRIGATOR) ×3 IMPLANT
SHEARS HARMONIC ACE PLUS 36CM (ENDOMECHANICALS) ×3 IMPLANT
SLEEVE XCEL OPT CAN 5 100 (ENDOMECHANICALS) ×6 IMPLANT
SPOGE SURGIFLO 8M (HEMOSTASIS)
SPONGE SURGIFLO 8M (HEMOSTASIS) IMPLANT
SUT MON AB 4-0 PS1 27 (SUTURE) ×3 IMPLANT
SUT VIC AB 0 CT1 18XCR BRD8 (SUTURE) ×4 IMPLANT
SUT VIC AB 0 CT1 36 (SUTURE) ×6 IMPLANT
SUT VIC AB 0 CT1 8-18 (SUTURE) ×2
SUT VICRYL 0 TIES 12 18 (SUTURE) ×3 IMPLANT
SUT VICRYL 0 UR6 27IN ABS (SUTURE) IMPLANT
TOWEL OR 17X24 6PK STRL BLUE (TOWEL DISPOSABLE) ×6 IMPLANT
TRAY FOLEY CATH SILVER 14FR (SET/KITS/TRAYS/PACK) ×3 IMPLANT
TROCAR XCEL NON-BLD 11X100MML (ENDOMECHANICALS) IMPLANT
TROCAR XCEL NON-BLD 5MMX100MML (ENDOMECHANICALS) ×3 IMPLANT
WARMER LAPAROSCOPE (MISCELLANEOUS) ×3 IMPLANT
WATER STERILE IRR 1000ML POUR (IV SOLUTION) IMPLANT

## 2016-01-02 NOTE — Op Note (Addendum)
Preoperative Diagnosis: Abnormal uterine bleeding, uterine fibroids, bilateral ovarian cysts Postoperative Diagnosis: same Procedure: Laparoscopy Assisted Vaginal Hysterectomy, Bilateral salpingectomy, right ovarian cystectomy Surgeon: Dr. [name]  Assistant: Dr. Cyndia Skeeters Anesthetic: General  IVF: 1800cc EBL: 200cc  UOP:600cc  Specimens: 1) bilateral tubes with uterus, ovarian cyst, pelvic washings Findings: No free fluid or omental studding appreciated. Normal appearing liver and bowel. Right ovary enlarged with 5cm cyst, left ovary normal,  Bilaterally fallopian tubes normal.  10cm boggy enlarged uterus with fibroids Uterine weight: 294g.   Procedure: The patient was taken to the operating room where general anesthesia was found to be adequate. The patient's abdomen was prepped with ChloraPrep. The perineum and vagina were prepped with multiple layers of Betadine. The patient was sterilely draped. A Foley catheter was placed in the bladder. A weight speculum was placed in the vagina. The IUD was removed.  A Hulka manipulator was placed without difficulty. Gloves were changed and attention was turned to the abdomen. The infraumbilical incision was incised with quarter percent Marcaine. An incision was made in the infraumbilical area and the Veress needle was inserted into the abdominal cavity without difficulty. The saline drop test was performed with opening pressure of 74mmHg. The abdomen was insufflated with CO2.   Under direct visualization, the laparoscopic trocar and the laparoscope were placed. Intraperitoneal placement was confirmed, findings as noted above. Two additional ports were placed in the right and left lower quadrants in the following manner: Each area was injected with quarter percent Marcaine. A small incision was made and a 5 mm trocar was inserted into the abdominal cavity under direct visualization. Pelvic washings were obtained.  Attention was turned  to the left adnexa.  The ureter was identified.   The harmonic was used to dissect, ligate and removed the left fallopian tube. The left utero-ovarian ligament was ligated. The left round ligament was grasped, elevated, fulgurated and divided using the Harmonic. The anterior leaf of the broad ligament was dissected medially and inferiorly to allow dissection of the vesicouterine flap.   Attention was turned to the right adnexa and the right ureter was identified.  Using the harmonic, a small incision was made within the ovarian cyst.  Using gentle traction, the cyst wall was separated from the remaining ovarian tissue.  In a similar fashion to the left, the harmonic was used to ligate and remove the right fallopian tube.  The utero-ovarian ligament was ligated. The right round ligament was clamped, elevated, ligated, and divided with the Harmonic. The vesicouterine fold of peritoneum was incised anteriorly for full dissection of the vesicouterine flap. Attention was then turned vaginally.    The uterine manipulator was removed and a weighted speculum was placed in the posterior vagina. The cervix was injected with half percent Marcaine with epinephrine. The cervix was then circumferentially incised with the bovie and the bladder was dissected off the pubovesical cervical fascia. The posterior cul-de-sac was entered sharply.The same procedure was performed anteriorly. A heany clamp was placed over the uterosacral ligaments bilaterally. These were transected and suture ligated with 0 vicryl. The cardinal ligaments were then clamped bilaterally and transected with the Ligasure. The uterine arteries and broad ligament was then serially ligated with the Ligasure. The uterus, bilateral fallopian tubes and cyst were removed from the operative field and sent to pathology. Adequate hemostasis was seen. The vaginal cuff was closed in a running locked fashion using 0 vicryl. Vaginal packing was placed. Gown and  gloves were changed and  attention was turned to the abdomen.  The pneumoperitoneum was reestablished. The pelvis was irrigated and inspected. Hemostasis was observed.  The lateral 5 mm trochars were removed under direct visualization. The pneumoperitoneum was allowed to escape. The subumbilical trocar was removed. Dermabond was placed over the three trocar sites. The patient tolerated her procedure well. She was awakened from her anesthetic without difficulty and then transported to the recovery room in stable condition.   Dr. Simona Huh assisted during the case as residents are not available to our service and due to the complex nature of the procedure.     [name], DO [phone] (pager) [phone] (office)

## 2016-01-02 NOTE — Transfer of Care (Signed)
Immediate Anesthesia Transfer of Care Note  Patient: Lori Ammirati  Procedure(s) Performed: Procedure(s): LAPAROSCOPIC ASSISTED VAGINAL HYSTERECTOMY  (Bilateral) BILATERAL SALPINGECTOMY (Bilateral) INTRAUTERINE DEVICE (IUD) REMOVAL (N/A)  Patient Location: PACU  Anesthesia Type:General  Level of Consciousness: awake, alert  and oriented  Airway & Oxygen Therapy: Patient Spontanous Breathing and Patient connected to nasal cannula oxygen  Post-op Assessment: Report given to RN, Post -op Vital signs reviewed and stable and Patient moving all extremities  Post vital signs: Reviewed and stable  Last Vitals:  Filed Vitals:   01/02/16 0854  BP: 145/85  Pulse: 64  Temp: 36.8 C  Resp: 20    Complications: No apparent anesthesia complications

## 2016-01-02 NOTE — Interval H&P Note (Signed)
History and Physical Interval Note:  01/02/2016 9:58 AM  Lori Davidson  has presented today for surgery, with the diagnosis of N93.9 AUB N83.209 Ovarian Cyst D25.9 Uterine Fibroid  The various methods of treatment have been discussed with the patient and family. After consideration of risks, benefits and other options for treatment, the patient has consented to  Procedure(s): LAPAROSCOPIC ASSISTED VAGINAL HYSTERECTOMY  (Bilateral) BILATERAL SALPINGECTOMY (Bilateral) as a surgical intervention .  The patient's history has been reviewed, patient examined, no change in status, stable for surgery.  I have reviewed the patient's chart and labs.  Questions were answered to the patient's satisfaction.     [name], M

## 2016-01-02 NOTE — Anesthesia Preprocedure Evaluation (Signed)
Anesthesia Evaluation  Patient identified by MRN, date of birth, ID band Patient awake    Reviewed: Allergy & Precautions, H&P , NPO status , Patient's Chart, lab work & pertinent test results, reviewed documented beta blocker date and time   Airway Mallampati: I  TM Distance: >3 FB Neck ROM: full    Dental no notable dental hx. (+) Teeth Intact   Pulmonary neg pulmonary ROS,    Pulmonary exam normal        Cardiovascular negative cardio ROS Normal cardiovascular exam     Neuro/Psych negative neurological ROS  negative psych ROS   GI/Hepatic Neg liver ROS, GERD  Medicated and Controlled,  Endo/Other    Renal/GU negative Renal ROS     Musculoskeletal   Abdominal Normal abdominal exam  (+)   Peds  Hematology negative hematology ROS (+)   Anesthesia Other Findings   Reproductive/Obstetrics negative OB ROS                             Anesthesia Physical Anesthesia Plan  ASA: II  Anesthesia Plan: General   Post-op Pain Management:    Induction: Intravenous  Airway Management Planned: Oral ETT  Additional Equipment:   Intra-op Plan:   Post-operative Plan: Extubation in OR  Informed Consent: I have reviewed the patients History and Physical, chart, labs and discussed the procedure including the risks, benefits and alternatives for the proposed anesthesia with the patient or authorized representative who has indicated his/her understanding and acceptance.   Dental Advisory Given  Plan Discussed with: CRNA and Surgeon  Anesthesia Plan Comments:         Anesthesia Quick Evaluation

## 2016-01-02 NOTE — Anesthesia Procedure Notes (Signed)
Procedure Name: Intubation Date/Time: 01/02/2016 10:21 AM Performed by: Hewitt Blade Pre-anesthesia Checklist: Patient identified, Emergency Drugs available, Suction available and Patient being monitored Patient Re-evaluated:Patient Re-evaluated prior to inductionOxygen Delivery Method: Circle system utilized Preoxygenation: Pre-oxygenation with 100% oxygen Intubation Type: IV induction Ventilation: Mask ventilation without difficulty Laryngoscope Size: Mac and 3 Grade View: Grade I Tube type: Oral Tube size: 7.0 mm Number of attempts: 1 Airway Equipment and Method: Stylet Placement Confirmation: ETT inserted through vocal cords under direct vision,  positive ETCO2 and breath sounds checked- equal and bilateral Secured at: 21 cm Tube secured with: Tape Dental Injury: Teeth and Oropharynx as per pre-operative assessment

## 2016-01-02 NOTE — Anesthesia Postprocedure Evaluation (Signed)
Anesthesia Post Note  Patient: Keyosha Certain  Procedure(s) Performed: Procedure(s) (LRB): LAPAROSCOPIC ASSISTED VAGINAL HYSTERECTOMY  (Bilateral) BILATERAL SALPINGECTOMY (Bilateral) INTRAUTERINE DEVICE (IUD) REMOVAL (N/A)  Patient location during evaluation: PACU Anesthesia Type: General Level of consciousness: awake Pain management: pain level controlled Vital Signs Assessment: post-procedure vital signs reviewed and stable Respiratory status: spontaneous breathing Cardiovascular status: stable Postop Assessment: no signs of nausea or vomiting Anesthetic complications: no    Last Vitals:  Filed Vitals:   01/02/16 1300 01/02/16 1315  BP: 125/66 121/72  Pulse: 88 93  Temp: 36.9 C   Resp: 18 18    Last Pain:  Filed Vitals:   01/02/16 1318  PainSc: 3                  Cem Kosman JR,JOHN Romey Mathieson

## 2016-01-03 ENCOUNTER — Encounter (HOSPITAL_COMMUNITY): Payer: Self-pay | Admitting: Obstetrics & Gynecology

## 2016-01-03 DIAGNOSIS — D259 Leiomyoma of uterus, unspecified: Secondary | ICD-10-CM | POA: Diagnosis not present

## 2016-01-03 LAB — BASIC METABOLIC PANEL
ANION GAP: 8 (ref 5–15)
BUN: 9 mg/dL (ref 6–20)
CALCIUM: 9.1 mg/dL (ref 8.9–10.3)
CO2: 27 mmol/L (ref 22–32)
CREATININE: 0.58 mg/dL (ref 0.44–1.00)
Chloride: 105 mmol/L (ref 101–111)
GFR calc Af Amer: >60 mL/min (ref >60)
GLUCOSE: 85 mg/dL (ref 65–99)
Potassium: 3.9 mmol/L (ref 3.5–5.1)
Sodium: 140 mmol/L (ref 135–145)

## 2016-01-03 LAB — CBC
HEMATOCRIT: 32.9 % — AB (ref 36.0–46.0)
Hemoglobin: 11.5 g/dL — ABNORMAL LOW (ref 12.0–15.0)
MCH: 32.2 pg (ref 26.0–34.0)
MCHC: 35 g/dL (ref 30.0–36.0)
MCV: 92.2 fL (ref 78.0–100.0)
PLATELETS: 194 10*3/uL (ref 150–400)
RBC: 3.57 MIL/uL — ABNORMAL LOW (ref 3.87–5.11)
RDW: 12 % (ref 11.5–15.5)
WBC: 8.5 10*3/uL (ref 4.0–10.5)

## 2016-01-03 MED ORDER — IBUPROFEN 600 MG PO TABS: 600.0000 mg | ORAL_TABLET | Freq: Four times a day (QID) | ORAL | Status: DC | PRN

## 2016-01-03 MED ORDER — DOCUSATE SODIUM 100 MG PO CAPS: 100.0000 mg | ORAL_CAPSULE | Freq: Two times a day (BID) | ORAL | Status: DC

## 2016-01-03 NOTE — Addendum Note (Signed)
Addendum  created 01/03/16 0910 by Asher Muir, CRNA   Modules edited: Clinical Notes   Clinical Notes:  File: [id number]

## 2016-01-03 NOTE — Progress Notes (Signed)
Discharge instructions provided to patient at bedside.  Activity, medications, follow up appointments, when to call the doctor and community resources discussed.  No questions at this time.  Patient left unit in stable condition with all personal belongings accompanied by staff.  Leighton Roach, RN------------------------

## 2016-01-03 NOTE — Progress Notes (Signed)
Vaginal packing removed as ordered, scant bloody drainage noted on packing. Patient tolerated well. Foley cath removed as ordered, tolerated well.

## 2016-01-03 NOTE — Anesthesia Postprocedure Evaluation (Signed)
Anesthesia Post Note  Patient: Lori Davidson  Procedure(s) Performed: Procedure(s) (LRB): LAPAROSCOPIC ASSISTED VAGINAL HYSTERECTOMY  (Bilateral) BILATERAL SALPINGECTOMY (Bilateral) INTRAUTERINE DEVICE (IUD) REMOVAL (N/A)  Patient location during evaluation: [facility] Anesthesia Type: General Level of consciousness: awake Pain management: satisfactory to patient Vital Signs Assessment: post-procedure vital signs reviewed and stable Respiratory status: spontaneous breathing Cardiovascular status: stable Anesthetic complications: no    Last Vitals:  Filed Vitals:   01/03/16 0114 01/03/16 0523  BP: 91/53 97/58  Pulse: 78 69  Temp: 36.9 C 36.6 C  Resp: 16 16    Last Pain:  Filed Vitals:   01/03/16 0545  PainSc: 0-No pain                 Oliva Montecalvo

## 2016-01-03 NOTE — Progress Notes (Signed)
Postoperative Note Day # 1  S:  Patient resting comfortable in bed.  Pain controlled.  Tolerating general. No flatus, no BM.  Packing removed this am, scant blood noted.  Ambulating without difficulty.  She denies n/v/f/c, SOB, or CP.  O: Temp:  [97.8 F (36.6 C)-98.7 F (37.1 C)] 97.8 F (36.6 C) (01/19 0523) Pulse Rate:  [64-97] 69 (01/19 0523) Resp:  [16-20] 16 (01/19 0523) BP: (91-145)/(48-85) 97/58 mmHg (01/19 0523) SpO2:  [97 %-100 %] 100 % (01/19 0523) Weight:  [73.936 kg (163 lb)] 73.936 kg (163 lb) (01/18 1400)  UOP: 1800cc/8hr  Gen: A&Ox3, NAD CV: RRR, no MRG Resp: CTAB Abdomen: soft, NT, ND +BS Incision: trocar sites- C/D/I with dermabond and honeycomb over umbilical incision Ext: No edema, no calf tenderness bilaterally, SCDs in place  Labs:  CBC    Component Value Date/Time   WBC 8.5 01/03/2016 0515   RBC 3.57* 01/03/2016 0515   HGB 11.5* 01/03/2016 0515   HCT 32.9* 01/03/2016 0515   PLT 194 01/03/2016 0515   MCV 92.2 01/03/2016 0515   MCH 32.2 01/03/2016 0515   MCHC 35.0 01/03/2016 0515   RDW 12.0 01/03/2016 0515    A/P: Pt is a 50 y.o. s/p LAVH, BS, right ovarian cystectomy, POD#1  - Pain well controlled -GU: UOP is adequate, foley removed this am- pt to void this am -GI: Tolerating general diet -Activity: encouraged sitting up to chair and ambulation as tolerated -Prophylaxis: early ambulation, SCDs while in bed -Labs: stable  DISPO: If pt able to void and pain well controlled will plan for discharge home later today.   [name], DO [phone] (pager) [phone] (office)

## 2016-01-03 NOTE — Discharge Instructions (Signed)
Laparoscopically Assisted Vaginal Hysterectomy, Care After Refer to this sheet in the next few weeks. These instructions provide you with information on caring for yourself after your procedure. Your health care provider may also give you more specific instructions. Your treatment has been planned according to current medical practices, but problems sometimes occur. Call your health care provider if you have any problems or questions after your procedure. WHAT TO EXPECT AFTER THE PROCEDURE After your procedure, it is typical to have the following:  Abdominal pain. You will be given pain medicine to control it.  Sore throat from the breathing tube that was inserted during surgery. HOME CARE INSTRUCTIONS  Only take over-the-counter or prescription medicines for pain, discomfort, or fever as directed by your health care provider.  For pain management you may alternate between percocet and motrin.  Percocet may cause constipation so please continue with colace twice daily as needed.  Do not take aspirin. It can cause bleeding.  Do not drive when taking pain medicine.  Follow your health care provider's advice regarding diet, exercise, lifting, driving, and general activities.  Resume your usual diet as directed and allowed.  Get plenty of rest and sleep.  Do not douche, use tampons, or have sexual intercourse for at least 6 weeks, or until your health care provider gives you permission.  Change your bandages (dressings) as directed by your health care provider.  Monitor your temperature and notify your health care provider of a fever.  Take showers instead of baths for 2-3 weeks.  Do not drink alcohol until your health care provider gives you permission.  If you develop constipation, you may take a mild laxative with your health care provider's permission. Bran foods may help with constipation problems. Drinking enough fluids to keep your urine clear or pale yellow may help as  well.  Try to have someone home with you for 1-2 weeks to help around the house.  Keep all of your follow-up appointments as directed by your health care provider. SEEK MEDICAL CARE IF:   You have swelling, redness, or increasing pain around your incision sites.  You have pus coming from your incision.  You notice a bad smell coming from your incision.  Your incision breaks open.  You feel dizzy or lightheaded.  You have pain or bleeding when you urinate.  You have persistent diarrhea.  You have persistent nausea and vomiting.  You have abnormal vaginal discharge.  You have a rash.  You have any type of abnormal reaction or develop an allergy to your medicine.  You have poor pain control with your prescribed medicine. SEEK IMMEDIATE MEDICAL CARE IF:   You have a fever.  You have severe abdominal pain.  You have chest pain.  You have shortness of breath.  You faint.  You have pain, swelling, or redness in your leg.  You have heavy vaginal bleeding with blood clots. MAKE SURE YOU:  Understand these instructions.  Will watch your condition.  Will get help right away if you are not doing well or get worse.   This information is not intended to replace advice given to you by your health care provider. Make sure you discuss any questions you have with your health care provider.   Document Released: 11/20/2011 Document Revised: 12/06/2013 Document Reviewed: 06/16/2013 Elsevier Interactive Patient Education [employer].

## 2016-01-11 NOTE — Discharge Summary (Signed)
Physician Discharge Summary  Patient ID: Lori Davidson MRN: [id number] DOB/AGE: [birth year] 50 y.o.  Admit date: 01/02/2016 Discharge date: 01/03/2016  Admission Diagnoses: Abnormal uterine bleeding, uterine leiomyoma  Discharge Diagnoses:  Active Problems:   Uterine leiomyoma   Discharged Condition: stable  Hospital Course: 50yo G3P3 who presents for LAVH, BS and ovarian cystectomy due to continued abnormal uterine bleeding and incidental finding of a 3.5cm ovarian cyst.  Pt has tried medical management with Mirena and other conservative options; however, her bleeding has remained heavy.  She underwent an LAVH, BS and right ovarian cystectomy on 01/02/16.  For information regarding the procedure, please see the operative report.  Her postoperative course was uncomplicated and she was discharged home in stable condition on POD#1.  Consults: None  Significant Diagnostic Studies: labs:  CBC Latest Ref Rng 01/03/2016 12/27/2015 09/13/2015  WBC 4.0 - 10.5 K/uL 8.5 4.9 8.6  Hemoglobin 12.0 - 15.0 g/dL 11.5(L) 14.9 14.4  Hematocrit 36.0 - 46.0 % 32.9(L) 41.6 42.5  Platelets 150 - 400 K/uL 194 266 219.0     Treatments: IV hydration, antibiotics: Ancef, analgesia: Toradol, Percocet and surgery: Laparoscopic assisted vaginal hysterectomy, bilateral salpingectomy and right ovarian cystectomy   Discharge Exam: Blood pressure 100/52, pulse 65, temperature 98.3 F (36.8 C), temperature source Axillary, resp. rate 16, height 5\' 5"  (1.651 m), weight 73.936 kg (163 lb), SpO2 100 %. Gen: A&Ox3, NAD CV: RRR, no MRG Resp: CTAB Abdomen: soft, NT, ND +BS Incision: trocar sites- C/D/I with dermabond and honeycomb over umbilical incision Ext: No edema, no calf tenderness bilaterally, SCDs in place  Disposition: 01-Home or Self Care     Medication List    TAKE these medications        cetirizine 10 MG tablet  Commonly known as:  ZYRTEC  Take 10 mg by mouth every other day.     diphenhydrAMINE 25  MG tablet  Commonly known as:  BENADRYL  Take 25 mg by mouth at bedtime as needed for itching.     docusate sodium 100 MG capsule  Commonly known as:  COLACE  Take 1 capsule (100 mg total) by mouth 2 (two) times daily.     ibuprofen 600 MG tablet  Commonly known as:  ADVIL,MOTRIN  Take 1 tablet (600 mg total) by mouth every 6 (six) hours as needed for mild pain.     multivitamin with minerals Tabs tablet  Take 1 tablet by mouth daily.     omeprazole 20 MG capsule  Commonly known as:  PRILOSEC  Take 20 mg by mouth daily.     triamcinolone cream 0.1 %  Commonly known as:  KENALOG  Apply 1 application topically 2 (two) times daily as needed (itchy skin).     Vitamin D (Cholecalciferol) 400 units Tabs  Take 1 tablet by mouth daily.           Follow-up Information    Follow up with [name], M, DO In 2 weeks.   Specialty:  Obstetrics and Gynecology   Contact information:   [id number] [place] [address] [phone]       Signed: Annalee Genta 01/11/2016, 6:27 AM

## 2016-03-09 ENCOUNTER — Encounter: Payer: Self-pay | Admitting: Emergency Medicine

## 2016-03-09 ENCOUNTER — Emergency Department
Admission: EM | Admit: 2016-03-09 | Discharge: 2016-03-09 | Disposition: A | Discharge status: Home / Self Care | Payer: BLUE CROSS/BLUE SHIELD | Attending: Emergency Medicine | Admitting: Emergency Medicine

## 2016-03-09 ENCOUNTER — Emergency Department: Payer: BLUE CROSS/BLUE SHIELD

## 2016-03-09 DIAGNOSIS — N83201 Unspecified ovarian cyst, right side: Secondary | ICD-10-CM | POA: Insufficient documentation

## 2016-03-09 DIAGNOSIS — R109 Unspecified abdominal pain: Secondary | ICD-10-CM

## 2016-03-09 LAB — COMPREHENSIVE METABOLIC PANEL
ALBUMIN: 4.5 g/dL (ref 3.5–5.0)
ALK PHOS: 64 U/L (ref 38–126)
ALT: 16 U/L (ref 14–54)
AST: 21 U/L (ref 15–41)
Anion gap: 8 (ref 5–15)
BUN: 13 mg/dL (ref 6–20)
CALCIUM: 9.6 mg/dL (ref 8.9–10.3)
CO2: 25 mmol/L (ref 22–32)
CREATININE: 0.62 mg/dL (ref 0.44–1.00)
Chloride: 101 mmol/L (ref 101–111)
GFR calc non Af Amer: >60 mL/min (ref >60)
GLUCOSE: 101 mg/dL — AB (ref 65–99)
Potassium: 3.8 mmol/L (ref 3.5–5.1)
SODIUM: 134 mmol/L — AB (ref 135–145)
Total Bilirubin: 1 mg/dL (ref 0.3–1.2)
Total Protein: 7.8 g/dL (ref 6.5–8.1)

## 2016-03-09 LAB — CBC
HCT: 42.8 % (ref 35.0–47.0)
Hemoglobin: 14.8 g/dL (ref 12.0–16.0)
MCH: 32.2 pg (ref 26.0–34.0)
MCHC: 34.5 g/dL (ref 32.0–36.0)
MCV: 93.3 fL (ref 80.0–100.0)
PLATELETS: 257 10*3/uL (ref 150–440)
RBC: 4.59 MIL/uL (ref 3.80–5.20)
RDW: 12.5 % (ref 11.5–14.5)
WBC: 8.3 10*3/uL (ref 3.6–11.0)

## 2016-03-09 LAB — URINALYSIS COMPLETE WITH MICROSCOPIC (ARMC ONLY)
BILIRUBIN URINE: NEGATIVE
GLUCOSE, UA: NEGATIVE mg/dL
HGB URINE DIPSTICK: NEGATIVE
Nitrite: NEGATIVE
Protein, ur: 30 mg/dL — AB
SPECIFIC GRAVITY, URINE: 1.027 (ref 1.005–1.030)
pH: 5 (ref 5.0–8.0)

## 2016-03-09 LAB — LIPASE, BLOOD: Lipase: 20 U/L (ref 11–51)

## 2016-03-09 MED ORDER — OXYCODONE-ACETAMINOPHEN 5-325 MG PO TABS: 2.0000 | ORAL_TABLET | Freq: Four times a day (QID) | ORAL | Status: DC | PRN

## 2016-03-09 NOTE — ED Notes (Signed)
Pain also radiates to right flank.

## 2016-03-09 NOTE — Discharge Instructions (Signed)
Abdominal Pain, Adult Many things can cause abdominal pain. Usually, abdominal pain is not caused by a disease and will improve without treatment. It can often be observed and treated at home. Your health care provider will do a physical exam and possibly order blood tests and X-rays to help determine the seriousness of your pain. However, in many cases, more time must pass before a clear cause of the pain can be found. Before that point, your health care provider may not know if you need more testing or further treatment. HOME CARE INSTRUCTIONS Monitor your abdominal pain for any changes. The following actions may help to alleviate any discomfort you are experiencing:  Only take over-the-counter or prescription medicines as directed by your health care provider.  Do not take laxatives unless directed to do so by your health care provider.  Try a clear liquid diet (broth, tea, or water) as directed by your health care provider. Slowly move to a bland diet as tolerated. SEEK MEDICAL CARE IF:  You have unexplained abdominal pain.  You have abdominal pain associated with nausea or diarrhea.  You have pain when you urinate or have a bowel movement.  You experience abdominal pain that wakes you in the night.  You have abdominal pain that is worsened or improved by eating food.  You have abdominal pain that is worsened with eating fatty foods.  You have a fever. SEEK IMMEDIATE MEDICAL CARE IF:  Your pain does not go away within 2 hours.  You keep throwing up (vomiting).  Your pain is felt only in portions of the abdomen, such as the right side or the left lower portion of the abdomen.  You pass bloody or black tarry stools. MAKE SURE YOU:  Understand these instructions.  Will watch your condition.  Will get help right away if you are not doing well or get worse.   This information Ovarian Cyst An ovarian cyst is a fluid-filled sac that forms on an ovary. The ovaries are small  organs that produce eggs in women. Various types of cysts can form on the ovaries. Most are not cancerous. Many do not cause problems, and they often go away on their own. Some may cause symptoms and require treatment. Common types of ovarian cysts include:  Functional cysts--These cysts may occur every month during the menstrual cycle. This is normal. The cysts usually go away with the next menstrual cycle if the woman does not get pregnant. Usually, there are no symptoms with a functional cyst.  Endometrioma cysts--These cysts form from the tissue that lines the uterus. They are also called "chocolate cysts" because they become filled with blood that turns brown. This type of cyst can cause pain in the lower abdomen during intercourse and with your menstrual period.  Cystadenoma cysts--This type develops from the cells on the outside of the ovary. These cysts can get very big and cause lower abdomen pain and pain with intercourse. This type of cyst can twist on itself, cut off its blood supply, and cause severe pain. It can also easily rupture and cause a lot of pain.  Dermoid cysts--This type of cyst is sometimes found in both ovaries. These cysts may contain different kinds of body tissue, such as skin, teeth, hair, or cartilage. They usually do not cause symptoms unless they get very big.  Theca lutein cysts--These cysts occur when too much of a certain hormone (human chorionic gonadotropin) is produced and overstimulates the ovaries to produce an egg. This is most  common after procedures used to assist with the conception of a baby (in vitro fertilization). CAUSES   Fertility drugs can cause a condition in which multiple large cysts are formed on the ovaries. This is called ovarian hyperstimulation syndrome.  A condition called polycystic ovary syndrome can cause hormonal imbalances that can lead to nonfunctional ovarian cysts. SIGNS AND SYMPTOMS  Many ovarian cysts do not cause symptoms. If  symptoms are present, they may include:  Pelvic pain or pressure.  Pain in the lower abdomen.  Pain during sexual intercourse.  Increasing girth (swelling) of the abdomen.  Abnormal menstrual periods.  Increasing pain with menstrual periods.  Stopping having menstrual periods without being pregnant. DIAGNOSIS  These cysts are commonly found during a routine or annual pelvic exam. Tests may be ordered to find out more about the cyst. These tests may include:  Ultrasound.  X-ray of the pelvis.  CT scan.  MRI.  Blood tests. TREATMENT  Many ovarian cysts go away on their own without treatment. Your health care provider may want to check your cyst regularly for 2-3 months to see if it changes. For women in menopause, it is particularly important to monitor a cyst closely because of the higher rate of ovarian cancer in menopausal women. When treatment is needed, it may include any of the following:  A procedure to drain the cyst (aspiration). This may be done using a long needle and ultrasound. It can also be done through a laparoscopic procedure. This involves using a thin, lighted tube with a tiny camera on the end (laparoscope) inserted through a small incision.  Surgery to remove the whole cyst. This may be done using laparoscopic surgery or an open surgery involving a larger incision in the lower abdomen.  Hormone treatment or birth control pills. These methods are sometimes used to help dissolve a cyst. HOME CARE INSTRUCTIONS   Only take over-the-counter or prescription medicines as directed by your health care provider.  Follow up with your health care provider as directed.  Get regular pelvic exams and Pap tests. SEEK MEDICAL CARE IF:   Your periods are late, irregular, or painful, or they stop.  Your pelvic pain or abdominal pain does not go away.  Your abdomen becomes larger or swollen.  You have pressure on your bladder or trouble emptying your bladder  completely.  You have pain during sexual intercourse.  You have feelings of fullness, pressure, or discomfort in your stomach.  You lose weight for no apparent reason.  You feel generally ill.  You become constipated.  You lose your appetite.  You develop acne.  You have an increase in body and facial hair.  You are gaining weight, without changing your exercise and eating habits.  You think you are pregnant. SEEK IMMEDIATE MEDICAL CARE IF:   You have increasing abdominal pain.  You feel sick to your stomach (nauseous), and you throw up (vomit).  You develop a fever that comes on suddenly.  You have abdominal pain during a bowel movement.  Your menstrual periods become heavier than usual. MAKE SURE YOU:  Understand these instructions.  Will watch your condition.  Will get help right away if you are not doing well or get worse.   This information is not intended to replace advice given to you by your health care provider. Make sure you discuss any questions you have with your health care provider.   Document Released: 12/01/2005 Document Revised: 12/06/2013 Document Reviewed: 08/08/2013 Elsevier Interactive Patient Education 2016  [employer]. is not intended to replace advice given to you by your health care provider. Make sure you discuss any questions you have with your health care provider.   Document Released: 09/10/2005 Document Revised: 08/22/2015 Document Reviewed: 08/10/2013 Elsevier Interactive Patient Education [employer].

## 2016-03-09 NOTE — ED Provider Notes (Signed)
[facility] Emergency Department Provider Note     Time seen: ----------------------------------------- 4:58 PM on 03/09/2016 -----------------------------------------    I have reviewed the triage vital signs and the nursing notes.   HISTORY  Chief Complaint Abdominal Pain    HPI Lori Davidson is a 50 y.o. female who presents to the ER being sent from the urgent care for right sided abdominal pain that started today. Patient describes sudden onset of pain. She did have an episode of this Monday to resolve spontaneously, she denies any fevers or urinary symptoms, had nausea earlier. Describes pain as sharp. At the urgent care she had Toradol which relieved her symptoms mostly. Earlier pain was 7 out of 10, currently pain is 3 out of 10.   Past Medical History  Diagnosis Date  . GERD (gastroesophageal reflux disease)   . H/O hiatal hernia   . Thyroid disease     Dx 2005  . Dysrhythmia     PAC's  . Hyperthyroidism 2005    resolved after medication for one year    Patient Active Problem List   Diagnosis Date Noted  . Uterine leiomyoma 01/02/2016  . Skin rash 09/13/2015  . GERD (gastroesophageal reflux disease) 09/13/2015  . History of hyperthyroidism 09/13/2015    Past Surgical History  Procedure Laterality Date  . Laparoscopic vaginal hysterectomy with salpingectomy Bilateral 01/02/2016    Procedure: LAPAROSCOPIC ASSISTED VAGINAL HYSTERECTOMY ;  Surgeon: [name], DO;  Location: [facility];  Service: Gynecology;  Laterality: Bilateral;  . Bilateral salpingectomy Bilateral 01/02/2016    Procedure: BILATERAL SALPINGECTOMY;  Surgeon: [name], DO;  Location: [facility];  Service: Gynecology;  Laterality: Bilateral;  . Iud removal N/A 01/02/2016    Procedure: INTRAUTERINE DEVICE (IUD) REMOVAL;  Surgeon: [name], DO;  Location: [facility];  Service: Gynecology;  Laterality: N/A;    Allergies Review of patient's allergies indicates no known  allergies.  Social History Social History  Substance Use Topics  . Smoking status: Never Smoker   . Smokeless tobacco: Never Used  . Alcohol Use: 0.0 oz/week    0 Standard drinks or equivalent per week     Comment: occasional wine    Review of Systems Constitutional: Negative for fever. Eyes: Negative for visual changes. ENT: Negative for sore throat. Cardiovascular: Negative for chest pain. Respiratory: Negative for shortness of breath. Gastrointestinal: Positive for abdominal pain and nausea Genitourinary: Negative for dysuria. Musculoskeletal: Negative for back pain. Skin: Negative for rash. Neurological: Negative for headaches, focal weakness or numbness.  10-point ROS otherwise negative.  ____________________________________________   PHYSICAL EXAM:  VITAL SIGNS: ED Triage Vitals  Enc Vitals Group     BP 03/09/16 1600 141/84 mmHg     Pulse Rate 03/09/16 1600 62     Resp 03/09/16 1600 18     Temp 03/09/16 1600 97.7 F (36.5 C)     Temp Source 03/09/16 1600 Oral     SpO2 03/09/16 1600 100 %     Weight 03/09/16 1600 165 lb (74.844 kg)     Height 03/09/16 1600 5\' 5"  (1.651 m)     Head Cir --      Peak Flow --      Pain Score 03/09/16 1600 5     Pain Loc --      Pain Edu? --      Excl. in [place]? --     Constitutional: Alert and oriented. Well appearing and in no distress. Eyes: Conjunctivae are normal. PERRL. Normal extraocular  movements. ENT   Head: Normocephalic and atraumatic.   Nose: No congestion/rhinnorhea.   Mouth/Throat: Mucous membranes are moist.   Neck: No stridor. Cardiovascular: Normal rate, regular rhythm. Normal and symmetric distal pulses are present in all extremities. No murmurs, rubs, or gallops. Respiratory: Normal respiratory effort without tachypnea nor retractions. Breath sounds are clear and equal bilaterally. No wheezes/rales/rhonchi. Gastrointestinal: Soft and nontender. No distention. No abdominal bruits. No pain in  McBurney's point Musculoskeletal: Nontender with normal range of motion in all extremities. No joint effusions.  No lower extremity tenderness nor edema. Neurologic:  Normal speech and language. No gross focal neurologic deficits are appreciated. Speech is normal. No gait instability. Skin:  Skin is warm, dry and intact. No rash noted. Psychiatric: Mood and affect are normal. Speech and behavior are normal. Patient exhibits appropriate insight and judgment. ____________________________________________  ED COURSE:  Pertinent labs & imaging results that were available during my care of the patient were reviewed by me and considered in my medical decision making (see chart for details). Likely renal colic, I will obtain basic labs and probable CT imaging. ____________________________________________    LABS (pertinent positives/negatives)  Labs Reviewed  COMPREHENSIVE METABOLIC PANEL - Abnormal; Notable for the following:    Sodium 134 (*)    Glucose, Bld 101 (*)    All other components within normal limits  URINALYSIS COMPLETEWITH MICROSCOPIC ([facility] ONLY) - Abnormal; Notable for the following:    Color, Urine AMBER (*)    APPearance TURBID (*)    Ketones, ur TRACE (*)    Protein, ur 30 (*)    Leukocytes, UA 2+ (*)    Bacteria, UA RARE (*)    Squamous Epithelial / LPF 0-5 (*)    All other components within normal limits  LIPASE, BLOOD  CBC    RADIOLOGY Images were viewed by me  CT renal protocol IMPRESSION: 1. No acute findings. 2. Normal appendix visualized. No renal or ureteral stones or obstructive uropathy. 3. 7 cm multi-cystic mass in the pelvis just above the bladder, likely ovarian in origin. Consider followup ultrasound with Doppler imaging for further assessment. 4. Status post hysterectomy. Right renal cyst. No other Abnormalities. IMPRESSION: Enlarged right ovary with multiple cystic lesions within. A large simple cyst is noted as well as a mildly septated  smaller cyst is noted. A hypoechoic lesion is noted within the substance of the ovary measuring just under 2 cm as described. These changes correspond with that seen on the recent CT examination. Neoplasm cannot be excluded. Short-term follow-up exam may be helpful to assess for stability. Gynecologic consultation is warranted ____________________________________________  FINAL ASSESSMENT AND PLAN  Abdominal pain, Ovarian cyst  Plan: Patient with labs and imaging as dictated above. Patient with large right ovarian cyst and cystic complex. She'll be referred back to her GYN doctor for follow-up. I will prescribe pain medicines to take as needed.   Earleen Newport, MD   Earleen Newport, MD 03/09/16 1928

## 2016-03-09 NOTE — ED Notes (Signed)
Patient c/o right flank pain that started about 10:30 am today. Patient states that she had the same pain on Monday but the pain subsided.   Patient was seen at [facility] today and given injection of Toradol and told to come here for imagining. Pain has decreased since getting medication.

## 2016-03-09 NOTE — ED Notes (Addendum)
Pt c/o RLQ pain that started today.  Had one episode Monday that resolved.  Has had nausea.  Denies fevers or urinary sx. Was seen at urgent care and given toradol.

## 2016-03-21 DIAGNOSIS — L720 Epidermal cyst: Secondary | ICD-10-CM | POA: Diagnosis not present

## 2016-04-10 DIAGNOSIS — N83209 Unspecified ovarian cyst, unspecified side: Secondary | ICD-10-CM | POA: Diagnosis not present

## 2016-07-09 ENCOUNTER — Other Ambulatory Visit: Payer: Self-pay | Admitting: Obstetrics & Gynecology

## 2016-07-09 DIAGNOSIS — Z1231 Encounter for screening mammogram for malignant neoplasm of breast: Secondary | ICD-10-CM

## 2016-07-17 ENCOUNTER — Ambulatory Visit
Admission: RE | Admit: 2016-07-17 | Discharge: 2016-07-17 | Disposition: A | Discharge status: Home / Self Care | Payer: BLUE CROSS/BLUE SHIELD | Source: Ambulatory Visit | Attending: Obstetrics & Gynecology | Admitting: Obstetrics & Gynecology

## 2016-07-17 DIAGNOSIS — Z1231 Encounter for screening mammogram for malignant neoplasm of breast: Secondary | ICD-10-CM | POA: Diagnosis not present

## 2016-07-30 DIAGNOSIS — M9901 Segmental and somatic dysfunction of cervical region: Secondary | ICD-10-CM | POA: Diagnosis not present

## 2016-07-30 DIAGNOSIS — M4604 Spinal enthesopathy, thoracic region: Secondary | ICD-10-CM | POA: Diagnosis not present

## 2016-07-30 DIAGNOSIS — M9902 Segmental and somatic dysfunction of thoracic region: Secondary | ICD-10-CM | POA: Diagnosis not present

## 2016-08-06 DIAGNOSIS — M4604 Spinal enthesopathy, thoracic region: Secondary | ICD-10-CM | POA: Diagnosis not present

## 2016-08-06 DIAGNOSIS — M9902 Segmental and somatic dysfunction of thoracic region: Secondary | ICD-10-CM | POA: Diagnosis not present

## 2016-08-06 DIAGNOSIS — M9901 Segmental and somatic dysfunction of cervical region: Secondary | ICD-10-CM | POA: Diagnosis not present

## 2016-11-04 ENCOUNTER — Encounter: Payer: Self-pay | Admitting: Internal Medicine

## 2016-11-04 ENCOUNTER — Ambulatory Visit (INDEPENDENT_AMBULATORY_CARE_PROVIDER_SITE_OTHER): Payer: BLUE CROSS/BLUE SHIELD | Admitting: Internal Medicine

## 2016-11-04 VITALS — BP 118/68 | HR 76 | Temp 98.5°F | Wt 171.5 lb

## 2016-11-04 DIAGNOSIS — B9689 Other specified bacterial agents as the cause of diseases classified elsewhere: Secondary | ICD-10-CM

## 2016-11-04 DIAGNOSIS — J019 Acute sinusitis, unspecified: Secondary | ICD-10-CM

## 2016-11-04 MED ORDER — AMOXICILLIN-POT CLAVULANATE 875-125 MG PO TABS: 1.0000 | ORAL_TABLET | Freq: Two times a day (BID) | ORAL | 0 refills | Status: DC

## 2016-11-04 NOTE — Progress Notes (Signed)
HPI  Pt presents to the clinic today with c/o nasal congestion, post nasal drip and cough. This started 2 weeks ago. She is blowing clear, sometimes green mucous out of her nose. The cough is productive of green mucous. She denies fever, chills or body aches. She has tried Zicam, Mucinex, Zyrtec, Ibuprofen and a nasal rinse with some relief. She has some history of seasonal allergies. She has not had sick contacts that she is aware of.   Review of Systems     Past Medical History:  Diagnosis Date  . Dysrhythmia    PAC's  . GERD (gastroesophageal reflux disease)   . H/O hiatal hernia   . Hyperthyroidism 2005   resolved after medication for one year  . Thyroid disease    Dx 2005    Family History  Problem Relation Age of Onset  . Breast cancer Mother   . Kidney cancer Maternal Grandmother   . Heart disease Maternal Grandfather   . Alzheimer's disease Maternal Grandfather     Social History   Social History  . Marital status: Married    Spouse name: N/A  . Number of children: N/A  . Years of education: N/A   Occupational History  . Not on file.   Social History Main Topics  . Smoking status: Never Smoker  . Smokeless tobacco: Never Used  . Alcohol use 0.0 oz/week     Comment: occasional wine  . Drug use: No  . Sexual activity: Not Currently    Birth control/ protection: IUD   Other Topics Concern  . Not on file   Social History Narrative  . No narrative on file    No Known Allergies   Constitutional: Denies headache, fatigue, fever or abrupt weight changes.  HEENT:  Positive nasal congestion and sore throat. Denies eye redness, ear pain, ringing in the ears, wax buildup, runny nose or bloody nose. Respiratory: Positive cough. Denies difficulty breathing or shortness of breath.  Cardiovascular: Denies chest pain, chest tightness, palpitations or swelling in the hands or feet.   No other specific complaints in a complete review of systems (except as listed in  HPI above).  Objective:  BP 118/68   Pulse 76   Temp 98.5 F (36.9 C) (Oral)   Wt 171 lb 8 oz (77.8 kg)   LMP 08/21/2015   SpO2 98%   BMI 28.54 kg/m    General: Appears her stated age, in NAD. HEENT: Head: normal shape and size, no sinus tenderness noted; Eyes: sclera white, no icterus, conjunctiva pink; Ears: Tm's gray and intact, normal light reflex; Nose: mucosa boggy and moist, septum midline; Throat/Mouth: + PND. Teeth present, mucosa erythematous and moist, no exudate noted, no lesions or ulcerations noted.  Neck:  No adenopathy noted.  Cardiovascular: Normal rate and rhythm. S1,S2 noted.  No murmur, rubs or gallops noted.  Pulmonary/Chest: Normal effort and positive vesicular breath sounds. No respiratory distress. No wheezes, rales or ronchi noted.       Assessment & Plan:   Acute bacterial sinusitis  Can use a Neti Pot which can be purchased from your local drug store. Flonase 2 sprays each nostril for 3 days and then as needed. Continue Zyrtef eRx for Augmentin BID for 10 days  RTC as needed or if symptoms persist. Webb Silversmith, NP

## 2016-11-04 NOTE — Patient Instructions (Signed)

## 2016-11-14 ENCOUNTER — Encounter: Payer: Self-pay | Admitting: Internal Medicine

## 2016-11-14 MED ORDER — FLUCONAZOLE 150 MG PO TABS: 150.0000 mg | ORAL_TABLET | Freq: Once | ORAL | 0 refills | Status: AC

## 2016-12-30 ENCOUNTER — Ambulatory Visit (INDEPENDENT_AMBULATORY_CARE_PROVIDER_SITE_OTHER): Payer: BLUE CROSS/BLUE SHIELD | Admitting: Internal Medicine

## 2016-12-30 ENCOUNTER — Encounter: Payer: Self-pay | Admitting: Internal Medicine

## 2016-12-30 ENCOUNTER — Other Ambulatory Visit: Payer: Self-pay | Admitting: Internal Medicine

## 2016-12-30 VITALS — BP 110/80 | HR 58 | Temp 97.8°F | Wt 172.0 lb

## 2016-12-30 DIAGNOSIS — K219 Gastro-esophageal reflux disease without esophagitis: Secondary | ICD-10-CM | POA: Diagnosis not present

## 2016-12-30 DIAGNOSIS — F439 Reaction to severe stress, unspecified: Secondary | ICD-10-CM

## 2016-12-30 DIAGNOSIS — R002 Palpitations: Secondary | ICD-10-CM | POA: Diagnosis not present

## 2016-12-30 DIAGNOSIS — K449 Diaphragmatic hernia without obstruction or gangrene: Secondary | ICD-10-CM | POA: Diagnosis not present

## 2016-12-30 LAB — H. PYLORI ANTIBODY, IGG: H PYLORI IGG: NEGATIVE

## 2016-12-30 LAB — TSH: TSH: 1.22 u[IU]/mL (ref 0.35–4.50)

## 2016-12-30 MED ORDER — OMEPRAZOLE 40 MG PO CPDR: 40.0000 mg | DELAYED_RELEASE_CAPSULE | Freq: Every day | ORAL | 2 refills | Status: DC

## 2016-12-30 NOTE — Patient Instructions (Signed)
Hiatal Hernia A hiatal hernia occurs when part of your stomach slides above the muscle that separates your abdomen from your chest (diaphragm). You can be born with a hiatal hernia (congenital), or it may develop over time. In almost all cases of hiatal hernia, only the top part of the stomach pushes through.  Many people have a hiatal hernia with no symptoms. The larger the hernia, the more likely that you will have symptoms. In some cases, a hiatal hernia allows stomach acid to flow back into the tube that carries food from your mouth to your stomach (esophagus). This may cause heartburn symptoms. Severe heartburn symptoms may mean you have developed a condition called gastroesophageal reflux disease (GERD).  CAUSES  Hiatal hernias are caused by a weakness in the opening (hiatus) where your esophagus passes through your diaphragm to attach to the upper part of your stomach. You may be born with a weakness in your hiatus, or a weakness can develop. RISK FACTORS Older age is a major risk factor for a hiatal hernia. Anything that increases pressure on your diaphragm can also increase your risk of a hiatal hernia. This includes:  Pregnancy.  Excess weight.  Frequent constipation. SIGNS AND SYMPTOMS  People with a hiatal hernia often have no symptoms. If symptoms develop, they are almost always caused by GERD. They may include:  Heartburn.  Belching.  Indigestion.  Trouble swallowing.  Coughing or wheezing.  Sore throat.  Hoarseness.  Chest pain. DIAGNOSIS  A hiatal hernia is sometimes found during an exam for another problem. Your health care provider may suspect a hiatal hernia if you have symptoms of GERD. Tests may be done to diagnose GERD. These may include:  X-rays of your stomach or chest.  An upper gastrointestinal (GI) series. This is an X-ray exam of your GI tract involving the use of a chalky liquid that you swallow. The liquid shows up clearly on the X-ray.  Endoscopy.  This is a procedure to look into your stomach using a thin, flexible tube that has a tiny camera and light on the end of it. TREATMENT  If you have no symptoms, you may not need treatment. If you have symptoms, treatment may include:  Dietary and lifestyle changes to help reduce GERD symptoms.  Medicines. These may include:  Over-the-counter antacids.  Medicines that make your stomach empty more quickly.  Medicines that block the production of stomach acid (H2 blockers).  Stronger medicines to reduce stomach acid (proton pump inhibitors).  You may need surgery to repair the hernia if other treatments are not helping. HOME CARE INSTRUCTIONS   Take all medicines as directed by your health care provider.  Quit smoking, if you smoke.  Try to achieve and maintain a healthy body weight.  Eat frequent small meals instead of three large meals a day. This keeps your stomach from getting too full.  Eat slowly.  Do not lie down right after eating.  Do noteat 1-2 hours before bed.   Do not drink beverages with caffeine. These include cola, coffee, cocoa, and tea.  Do not drink alcohol.  Avoid foods that can make symptoms of GERD worse. These may include:  Fatty foods.  Citrus fruits.  Other foods and drinks that contain acid.  Avoid putting pressure on your belly. Anything that puts pressure on your belly increases the amount of acid that may be pushed up into your esophagus.   Avoid bending over, especially after eating.  Raise the head of your bed   by putting blocks under the legs. This keeps your head and esophagus higher than your stomach.  Do not wear tight clothing around your chest or stomach.  Try not to strain when having a bowel movement, when urinating, or when lifting heavy objects. SEEK MEDICAL CARE IF:  Your symptoms are not controlled with medicines or lifestyle changes.  You are having trouble swallowing.  You have coughing or wheezing that will not  go away. SEEK IMMEDIATE MEDICAL CARE IF:  Your pain is getting worse.  Your pain spreads to your arms, neck, jaw, teeth, or back.  You have shortness of breath.  You sweat for no reason.  You feel sick to your stomach (nauseous) or vomit.  You vomit blood.  You have bright red blood in your stools.  You have black, tarry stools.  This information is not intended to replace advice given to you by your health care provider. Make sure you discuss any questions you have with your health care provider. Document Released: 02/21/2004 Document Revised: 03/24/2016 Document Reviewed: 11/18/2013 Elsevier Interactive Patient Education  2017 Elsevier Inc.  

## 2016-12-30 NOTE — Progress Notes (Signed)
Subjective:    Patient ID: Lori Davidson, female    DOB: 09-Jan-1966, 51 y.o.   MRN: [id number]  HPI  Pt presents to the clinic today to discuss her hiatal hernia. She reports she was originally diagnosed with this in 2007. She reports over the last, she has started having a burning sensation behind her sternum and in her upper abdomen. She denies pain but reports pressure in the upper part of her abdomen. She has noticed some difficulty swallowing as well. She denies nausea, vomiting, diarrhea or constipation. She feels like this is being triggered by tomato based and spicy foods. She has tried Tums in the past and recently started taking Prilosec OTC with some relief. She reports over the last month, her symptoms have worsened. She is taking Tums multiple times a night in addition to her Prilosec. She had an upper endoscopy in 2007.  She also c/o ongoing palpitations. She notices this on a daily basis. She denies dizziness, chest pain or shortness of breath. She has had this in the past when she was diagnosed with hyperthyroidism, and was actually on Metoprolol at that time. She denies increase in caffeine consumption. She has had some increased stress... Her divorce will be final next month, both of her daughters just had miscarriages, in the same month. She is not interested in any treatment for her stress as this time.  Review of Systems      Past Medical History:  Diagnosis Date  . Dysrhythmia    PAC's  . GERD (gastroesophageal reflux disease)   . H/O hiatal hernia   . Hyperthyroidism 2005   resolved after medication for one year  . Thyroid disease    Dx 2005    Current Outpatient Prescriptions  Medication Sig Dispense Refill  . cetirizine (ZYRTEC) 10 MG tablet Take 10 mg by mouth every other day.     . cholecalciferol (VITAMIN D) 1000 units tablet Take 1,000 Units by mouth daily.    . diphenhydrAMINE (BENADRYL) 25 MG tablet Take 25 mg by mouth at bedtime as needed for itching.      . Multiple Vitamin (MULTIVITAMIN WITH MINERALS) TABS tablet Take 1 tablet by mouth daily.    Marland Kitchen omeprazole (PRILOSEC) 20 MG capsule Take 20 mg by mouth daily.    Marland Kitchen triamcinolone cream (KENALOG) 0.1 % Apply 1 application topically 2 (two) times daily as needed (itchy skin).     No current facility-administered medications for this visit.     No Known Allergies  Family History  Problem Relation Age of Onset  . Breast cancer Mother   . Kidney cancer Maternal Grandmother   . Heart disease Maternal Grandfather   . Alzheimer's disease Maternal Grandfather     Social History   Social History  . Marital status: Married    Spouse name: N/A  . Number of children: N/A  . Years of education: N/A   Occupational History  . Not on file.   Social History Main Topics  . Smoking status: Never Smoker  . Smokeless tobacco: Never Used  . Alcohol use 0.0 oz/week     Comment: occasional wine  . Drug use: No  . Sexual activity: Not Currently    Birth control/ protection: IUD   Other Topics Concern  . Not on file   Social History Narrative  . No narrative on file     Constitutional: Denies fever, malaise, fatigue, headache or abrupt weight changes.  Respiratory: Denies difficulty breathing, shortness of breath, cough  or sputum production.   Cardiovascular: Pt reports palpitations. Denies chest pain, chest tightness, or swelling in the hands or feet.  Gastrointestinal: Pt reports reflux. Denies abdominal pain, bloating, constipation, diarrhea or blood in the stool.  Psych: Pt reports stress. Denies anxiety, depression, SI/HI.  No other specific complaints in a complete review of systems (except as listed in HPI above).  Objective:   Physical Exam  BP 110/80   Pulse (!) 58   Temp 97.8 F (36.6 C) (Oral)   Wt 172 lb (78 kg)   LMP 08/21/2015   SpO2 98%   BMI 28.62 kg/m  Wt Readings from Last 3 Encounters:  12/30/16 172 lb (78 kg)  11/04/16 171 lb 8 oz (77.8 kg)  03/09/16 165  lb (74.8 kg)    General: Appears her stated age, well developed, well nourished in NAD. Neck:  Neck supple, trachea midline. No masses, lumps or thyromegaly present.  Cardiovascular: Normal rate and rhythm. S1,S2 noted.  No murmur, rubs or gallops noted.  Pulmonary/Chest: Normal effort and positive vesicular breath sounds. No respiratory distress. No wheezes, rales or ronchi noted.  Abdomen: Soft and nontender. Normal bowel sounds. No distention or masses noted.  Psychiatric: Mood and affect normal. Behavior is normal. Judgment and thought content normal.     BMET    Component Value Date/Time   NA 134 (L) 03/09/2016 1602   K 3.8 03/09/2016 1602   CL 101 03/09/2016 1602   CO2 25 03/09/2016 1602   GLUCOSE 101 (H) 03/09/2016 1602   BUN 13 03/09/2016 1602   CREATININE 0.62 03/09/2016 1602   CALCIUM 9.6 03/09/2016 1602   GFRNONAA >60 03/09/2016 1602   GFRAA >60 03/09/2016 1602    Lipid Panel  No results found for: CHOL, TRIG, HDL, CHOLHDL, VLDL, LDLCALC  CBC    Component Value Date/Time   WBC 8.3 03/09/2016 1602   RBC 4.59 03/09/2016 1602   HGB 14.8 03/09/2016 1602   HCT 42.8 03/09/2016 1602   PLT 257 03/09/2016 1602   MCV 93.3 03/09/2016 1602   MCH 32.2 03/09/2016 1602   MCHC 34.5 03/09/2016 1602   RDW 12.5 03/09/2016 1602    Hgb A1C No results found for: HGBA1C       Assessment & Plan:   GERD secondary to Hiatal Hernia:  Encouraged her to continue to cut out tomato based foods and spicy foods Will check H Pylori today- if positive, will treat If negative, will increase Prilosec to 40 mg daily If worsens, will refer back to GI for repeat upper GI  Palpitations:  ? Stress related vs hyperthyroidism TSH today Discussed stress management techniques She is not interested in medication management for stress at this time  Will follow up after labs, advised her to make an appt for her annual exam Webb Silversmith, NP

## 2017-01-02 ENCOUNTER — Encounter: Payer: Self-pay | Admitting: Internal Medicine

## 2017-01-02 MED ORDER — SUCRALFATE 1 G PO TABS: 1.0000 g | ORAL_TABLET | Freq: Three times a day (TID) | ORAL | 0 refills | Status: DC

## 2017-02-16 ENCOUNTER — Encounter: Payer: Self-pay | Admitting: Internal Medicine

## 2017-02-16 DIAGNOSIS — K219 Gastro-esophageal reflux disease without esophagitis: Secondary | ICD-10-CM

## 2017-02-16 DIAGNOSIS — K449 Diaphragmatic hernia without obstruction or gangrene: Secondary | ICD-10-CM

## 2017-02-17 DIAGNOSIS — L300 Nummular dermatitis: Secondary | ICD-10-CM | POA: Diagnosis not present

## 2017-02-17 DIAGNOSIS — L814 Other melanin hyperpigmentation: Secondary | ICD-10-CM | POA: Diagnosis not present

## 2017-02-17 DIAGNOSIS — D485 Neoplasm of uncertain behavior of skin: Secondary | ICD-10-CM | POA: Diagnosis not present

## 2017-02-17 DIAGNOSIS — D2362 Other benign neoplasm of skin of left upper limb, including shoulder: Secondary | ICD-10-CM | POA: Diagnosis not present

## 2017-02-17 DIAGNOSIS — L820 Inflamed seborrheic keratosis: Secondary | ICD-10-CM | POA: Diagnosis not present

## 2017-02-17 DIAGNOSIS — D1801 Hemangioma of skin and subcutaneous tissue: Secondary | ICD-10-CM | POA: Diagnosis not present

## 2017-02-17 DIAGNOSIS — D2271 Melanocytic nevi of right lower limb, including hip: Secondary | ICD-10-CM | POA: Diagnosis not present

## 2017-03-16 ENCOUNTER — Ambulatory Visit (INDEPENDENT_AMBULATORY_CARE_PROVIDER_SITE_OTHER): Payer: BLUE CROSS/BLUE SHIELD | Admitting: Gastroenterology

## 2017-03-16 ENCOUNTER — Encounter: Payer: Self-pay | Admitting: Gastroenterology

## 2017-03-16 ENCOUNTER — Ambulatory Visit: Payer: BLUE CROSS/BLUE SHIELD | Admitting: Gastroenterology

## 2017-03-16 VITALS — BP 129/72 | HR 73 | Temp 98.2°F | Ht 65.0 in | Wt 171.0 lb

## 2017-03-16 DIAGNOSIS — K219 Gastro-esophageal reflux disease without esophagitis: Secondary | ICD-10-CM

## 2017-03-16 NOTE — Progress Notes (Signed)
Gastroenterology Consultation  Referring Provider:     Jearld Fenton, NP Primary Care Physician:  Webb Silversmith, NP Primary Gastroenterologist:  Dr. Allen Norris     Reason for Consultation:     GERD        HPI:   Lori Davidson is a 51 y.o. y/o female referred for consultation & management of GERD by Dr. Webb Silversmith, NP.  This patient comes in today with a history of heartburn for many years. The patient states she was started many years ago on 20 mg of omeprazole and states that it was upsetting her stomach so she stopped. Over the last few years these symptoms have gotten worse whereupon she is now on Prilosec 40 mg that she takes either at one time or split set up to 20 mg twice a day. The patient does not see any difference in the 20 mg twice a day that she does the 40 mg once a day. The patient also reports that she has burning in her mouth and the back of her throat. The patient denies any unexplained weight loss fevers chills black stools or bloody stools. The patient also denies any food getting stuck when she eats. The patient is now on omeprazole but states that she continues to drink caffeinated drinks and does not follow any certain diet for her heartburn. The patient states that she would rather not be on any medication if that was possible.  Past Medical History:  Diagnosis Date  . Dysrhythmia    PAC's  . GERD (gastroesophageal reflux disease)   . H/O hiatal hernia   . Hyperthyroidism 2005   resolved after medication for one year  . Thyroid disease    Dx 2005    Past Surgical History:  Procedure Laterality Date  . BILATERAL SALPINGECTOMY Bilateral 01/02/2016   Procedure: BILATERAL SALPINGECTOMY;  Surgeon: [name], DO;  Location: [facility];  Service: Gynecology;  Laterality: Bilateral;  . IUD REMOVAL N/A 01/02/2016   Procedure: INTRAUTERINE DEVICE (IUD) REMOVAL;  Surgeon: [name], DO;  Location: [facility];  Service: Gynecology;  Laterality: N/A;  . LAPAROSCOPIC VAGINAL  HYSTERECTOMY WITH SALPINGECTOMY Bilateral 01/02/2016   Procedure: LAPAROSCOPIC ASSISTED VAGINAL HYSTERECTOMY ;  Surgeon: [name], DO;  Location: [facility];  Service: Gynecology;  Laterality: Bilateral;    Prior to Admission medications   Medication Sig Start Date End Date Taking? Authorizing Provider  cetirizine (ZYRTEC) 10 MG tablet Take 10 mg by mouth every other day.    Yes Historical Provider, MD  cholecalciferol (VITAMIN D) 1000 units tablet Take 1,000 Units by mouth daily.   Yes Historical Provider, MD  diphenhydrAMINE (BENADRYL) 25 MG tablet Take 25 mg by mouth at bedtime as needed for itching.   Yes Historical Provider, MD  Multiple Vitamin (MULTIVITAMIN WITH MINERALS) TABS tablet Take 1 tablet by mouth daily.   Yes Historical Provider, MD  omeprazole (PRILOSEC) 40 MG capsule Take 1 capsule (40 mg total) by mouth daily. 12/30/16  Yes Jearld Fenton, NP  triamcinolone cream (KENALOG) 0.1 % Apply 1 application topically 2 (two) times daily as needed (itchy skin).   Yes Historical Provider, MD  sucralfate (CARAFATE) 1 g tablet Take 1 tablet (1 g total) by mouth 4 (four) times daily -  with meals and at bedtime. Patient not taking: Reported on 03/16/2017 01/02/17   Jearld Fenton, NP    Family History  Problem Relation Age of Onset  . Breast cancer Mother   . Kidney cancer  Maternal Grandmother   . Heart disease Maternal Grandfather   . Alzheimer's disease Maternal Grandfather      Social History  Substance Use Topics  . Smoking status: Never Smoker  . Smokeless tobacco: Never Used  . Alcohol use 0.0 oz/week     Comment: occasional wine    Allergies as of 03/16/2017  . (No Known Allergies)    Review of Systems:    All systems reviewed and negative except where noted in HPI.   Physical Exam:  BP 129/72   Pulse 73   Temp 98.2 F (36.8 C) (Oral)   Ht 5\' 5"  (1.651 m)   Wt 171 lb (77.6 kg)   LMP 08/21/2015   BMI 28.46 kg/m  Patient's last menstrual period was  08/21/2015. Psych:  Alert and cooperative. Normal mood and affect. General:   Alert,  Well-developed, well-nourished, pleasant and cooperative in NAD Head:  Normocephalic and atraumatic. Eyes:  Sclera clear, no icterus.   Conjunctiva pink. Ears:  Normal auditory acuity. Nose:  No deformity, discharge, or lesions. Mouth:  No deformity or lesions,oropharynx pink & moist. Neck:  Supple; no masses or thyromegaly. Lungs:  Respirations even and unlabored.  Clear throughout to auscultation.   No wheezes, crackles, or rhonchi. No acute distress. Heart:  Regular rate and rhythm; no murmurs, clicks, rubs, or gallops. Abdomen:  Normal bowel sounds.  No bruits.  Soft, non-tender and non-distended without masses, hepatosplenomegaly or hernias noted.  No guarding or rebound tenderness.  Negative Carnett sign.   Rectal:  Deferred.  Msk:  Symmetrical without gross deformities.  Good, equal movement & strength bilaterally. Pulses:  Normal pulses noted. Extremities:  No clubbing or edema.  No cyanosis. Neurologic:  Alert and oriented x3;  grossly normal neurologically. Skin:  Intact without significant lesions or rashes.  No jaundice. Lymph Nodes:  No significant cervical adenopathy. Psych:  Alert and cooperative. Normal mood and affect.  Imaging Studies: No results found.  Assessment and Plan:   [name] is a 51 y.o. y/o female who comes in with chronic heartburn who reports that she continues to have acid breakthrough on omeprazole 40 mg a day. The patient also reports that she has some abdominal discomfort when she takes the medication and thinks that it is associated with the omeprazole. The patient has been given samples of Dexilant and has been encouraged to undergo an upper endoscopy because of her chronic reflux. The patient has also been told that since she has not had a screening colonoscopy that she should undergo a screening colonoscopy. The patient has also been told that colon cancer is  one of the leading causes of cancer death and she should strongly consider having the colonoscopy. The patient states she would like to hold off on the EGD or colonoscopy would only like to try the medication at this time. The patient has been given my card and told again the risk of esophageal cancer from Barrett's esophagus and colon cancer from polyps. The patient states she understands and will call our office when and if she decides to undergo the EGD and colonoscopy.    Lucilla Lame, MD. Marval Regal   Note: This dictation was prepared with Dragon dictation along with smaller phrase technology. Any transcriptional errors that result from this process are unintentional.

## 2017-03-19 ENCOUNTER — Other Ambulatory Visit: Payer: Self-pay

## 2017-03-19 DIAGNOSIS — Z1211 Encounter for screening for malignant neoplasm of colon: Secondary | ICD-10-CM

## 2017-03-25 ENCOUNTER — Other Ambulatory Visit: Payer: Self-pay

## 2017-03-25 ENCOUNTER — Encounter: Payer: Self-pay | Admitting: *Deleted

## 2017-03-25 DIAGNOSIS — Z1211 Encounter for screening for malignant neoplasm of colon: Secondary | ICD-10-CM

## 2017-03-25 MED ORDER — PEG 3350-KCL-NA BICARB-NACL 420 G PO SOLR: 4000.0000 mL | Freq: Once | ORAL | 0 refills | Status: AC

## 2017-03-27 NOTE — Discharge Instructions (Signed)

## 2017-03-30 ENCOUNTER — Ambulatory Visit: Payer: BLUE CROSS/BLUE SHIELD | Admitting: Anesthesiology

## 2017-03-30 ENCOUNTER — Encounter
Admission: RE | Disposition: A | Discharge status: Home / Self Care | Payer: Self-pay | Source: Ambulatory Visit | Attending: Gastroenterology

## 2017-03-30 ENCOUNTER — Ambulatory Visit
Admission: RE | Admit: 2017-03-30 | Discharge: 2017-03-30 | Disposition: A | Discharge status: Home / Self Care | Payer: BLUE CROSS/BLUE SHIELD | Source: Ambulatory Visit | Attending: Gastroenterology | Admitting: Gastroenterology

## 2017-03-30 ENCOUNTER — Telehealth: Payer: Self-pay | Admitting: Gastroenterology

## 2017-03-30 DIAGNOSIS — Z79899 Other long term (current) drug therapy: Secondary | ICD-10-CM | POA: Insufficient documentation

## 2017-03-30 DIAGNOSIS — B3781 Candidal esophagitis: Secondary | ICD-10-CM | POA: Diagnosis not present

## 2017-03-30 DIAGNOSIS — K2950 Unspecified chronic gastritis without bleeding: Secondary | ICD-10-CM | POA: Insufficient documentation

## 2017-03-30 DIAGNOSIS — K219 Gastro-esophageal reflux disease without esophagitis: Secondary | ICD-10-CM | POA: Diagnosis not present

## 2017-03-30 DIAGNOSIS — K640 First degree hemorrhoids: Secondary | ICD-10-CM | POA: Insufficient documentation

## 2017-03-30 DIAGNOSIS — K2970 Gastritis, unspecified, without bleeding: Secondary | ICD-10-CM | POA: Diagnosis not present

## 2017-03-30 DIAGNOSIS — K2930 Chronic superficial gastritis without bleeding: Secondary | ICD-10-CM | POA: Diagnosis not present

## 2017-03-30 DIAGNOSIS — Z1211 Encounter for screening for malignant neoplasm of colon: Secondary | ICD-10-CM

## 2017-03-30 DIAGNOSIS — R12 Heartburn: Secondary | ICD-10-CM | POA: Diagnosis not present

## 2017-03-30 DIAGNOSIS — K449 Diaphragmatic hernia without obstruction or gangrene: Secondary | ICD-10-CM | POA: Diagnosis not present

## 2017-03-30 HISTORY — PX: ESOPHAGOGASTRODUODENOSCOPY (EGD) WITH PROPOFOL: SHX5813

## 2017-03-30 HISTORY — PX: COLONOSCOPY WITH PROPOFOL: SHX5780

## 2017-03-30 SURGERY — COLONOSCOPY WITH PROPOFOL
Anesthesia: Monitor Anesthesia Care | Wound class: Contaminated

## 2017-03-30 MED ORDER — LACTATED RINGERS IV SOLN
INTRAVENOUS | Status: DC
  Administered 2017-03-30 (×3): via INTRAVENOUS

## 2017-03-30 MED ORDER — PROPOFOL 10 MG/ML IV BOLUS
INTRAVENOUS | Status: DC | PRN
  Administered 2017-03-30: 40 mg via INTRAVENOUS
  Administered 2017-03-30 (×3): 20 mg via INTRAVENOUS
  Administered 2017-03-30: 40 mg via INTRAVENOUS
  Administered 2017-03-30: 20 mg via INTRAVENOUS
  Administered 2017-03-30: 30 mg via INTRAVENOUS
  Administered 2017-03-30 (×3): 40 mg via INTRAVENOUS
  Administered 2017-03-30 (×3): 20 mg via INTRAVENOUS
  Administered 2017-03-30: 80 mg via INTRAVENOUS
  Administered 2017-03-30: 20 mg via INTRAVENOUS
  Administered 2017-03-30: 40 mg via INTRAVENOUS

## 2017-03-30 MED ORDER — SIMETHICONE 40 MG/0.6ML PO SUSP
ORAL | Status: DC | PRN
  Administered 2017-03-30: 09:00:00

## 2017-03-30 MED ORDER — GLYCOPYRROLATE 0.2 MG/ML IJ SOLN
INTRAMUSCULAR | Status: DC | PRN
  Administered 2017-03-30: 0.1 mg via INTRAVENOUS

## 2017-03-30 MED ORDER — LIDOCAINE HCL (CARDIAC) 20 MG/ML IV SOLN
INTRAVENOUS | Status: DC | PRN
Start: 2017-03-30 — End: 2017-03-30
  Administered 2017-03-30: 30 mg via INTRAVENOUS

## 2017-03-30 SURGICAL SUPPLY — 36 items
BALLN DILATOR 10-12 8 (BALLOONS)
BALLN DILATOR 12-15 8 (BALLOONS)
BALLN DILATOR 15-18 8 (BALLOONS)
BALLN DILATOR CRE 0-12 8 (BALLOONS)
BALLN DILATOR ESOPH 8 10 CRE (MISCELLANEOUS) IMPLANT
BALLOON DILATOR 12-15 8 (BALLOONS) IMPLANT
BALLOON DILATOR 15-18 8 (BALLOONS) IMPLANT
BALLOON DILATOR CRE 0-12 8 (BALLOONS) IMPLANT
BLOCK BITE 60FR ADLT L/F GRN (MISCELLANEOUS) ×2 IMPLANT
BRUSH CYTO GASTROSCOPE 3.0 (MISCELLANEOUS) ×2 IMPLANT
CANISTER SUCT 1200ML W/VALVE (MISCELLANEOUS) ×2 IMPLANT
CLIP HMST 235XBRD CATH ROT (MISCELLANEOUS) IMPLANT
CLIP RESOLUTION 360 11X235 (MISCELLANEOUS)
FCP ESCP3.2XJMB 240X2.8X (MISCELLANEOUS)
FORCEPS BIOP RAD 4 LRG CAP 4 (CUTTING FORCEPS) ×2 IMPLANT
FORCEPS BIOP RJ4 240 W/NDL (MISCELLANEOUS)
FORCEPS ESCP3.2XJMB 240X2.8X (MISCELLANEOUS) IMPLANT
GOWN CVR UNV OPN BCK APRN NK (MISCELLANEOUS) ×2 IMPLANT
GOWN ISOL THUMB LOOP REG UNIV (MISCELLANEOUS) ×2
INJECTOR VARIJECT VIN23 (MISCELLANEOUS) IMPLANT
KIT DEFENDO VALVE AND CONN (KITS) IMPLANT
KIT ENDO PROCEDURE OLY (KITS) ×2 IMPLANT
MARKER SPOT ENDO TATTOO 5ML (MISCELLANEOUS) IMPLANT
PAD GROUND ADULT SPLIT (MISCELLANEOUS) IMPLANT
PROBE APC STR FIRE (PROBE) IMPLANT
RETRIEVER NET PLAT FOOD (MISCELLANEOUS) IMPLANT
RETRIEVER NET ROTH 2.5X230 LF (MISCELLANEOUS) IMPLANT
SNARE SHORT THROW 13M SML OVAL (MISCELLANEOUS) IMPLANT
SNARE SHORT THROW 30M LRG OVAL (MISCELLANEOUS) IMPLANT
SNARE SNG USE RND 15MM (INSTRUMENTS) IMPLANT
SPOT EX ENDOSCOPIC TATTOO (MISCELLANEOUS)
SYR INFLATION 60ML (SYRINGE) IMPLANT
TRAP ETRAP POLY (MISCELLANEOUS) IMPLANT
VARIJECT INJECTOR VIN23 (MISCELLANEOUS)
WATER STERILE IRR 250ML POUR (IV SOLUTION) ×2 IMPLANT
WIRE CRE 18-20MM 8CM F G (MISCELLANEOUS) IMPLANT

## 2017-03-30 NOTE — Transfer of Care (Signed)
Immediate Anesthesia Transfer of Care Note  Patient: Lori Davidson  Procedure(s) Performed: Procedure(s) with comments: COLONOSCOPY WITH PROPOFOL (N/A) ESOPHAGOGASTRODUODENOSCOPY (EGD) WITH PROPOFOL (N/A) - requests early as possible  Patient Location: PACU  Anesthesia Type: MAC  Level of Consciousness: awake, alert  and patient cooperative  Airway and Oxygen Therapy: Patient Spontanous Breathing and Patient connected to supplemental oxygen  Post-op Assessment: Post-op Vital signs reviewed, Patient's Cardiovascular Status Stable, Respiratory Function Stable, Patent Airway and No signs of Nausea or vomiting  Post-op Vital Signs: Reviewed and stable  Complications: No apparent anesthesia complications

## 2017-03-30 NOTE — Anesthesia Preprocedure Evaluation (Signed)
Anesthesia Evaluation  Patient identified by MRN, date of birth, ID band Patient awake    Reviewed: Allergy & Precautions, H&P , NPO status , Patient's Chart, lab work & pertinent test results  Airway Mallampati: II  TM Distance: >3 FB Neck ROM: full    Dental no notable dental hx.    Pulmonary    Pulmonary exam normal        Cardiovascular Normal cardiovascular exam     Neuro/Psych    GI/Hepatic GERD  ,  Endo/Other    Renal/GU      Musculoskeletal   Abdominal   Peds  Hematology   Anesthesia Other Findings   Reproductive/Obstetrics                             Anesthesia Physical Anesthesia Plan  ASA: II  Anesthesia Plan: MAC   Post-op Pain Management:    Induction:   Airway Management Planned:   Additional Equipment:   Intra-op Plan:   Post-operative Plan:   Informed Consent: I have reviewed the patients History and Physical, chart, labs and discussed the procedure including the risks, benefits and alternatives for the proposed anesthesia with the patient or authorized representative who has indicated his/her understanding and acceptance.     Plan Discussed with:   Anesthesia Plan Comments:         Anesthesia Quick Evaluation  

## 2017-03-30 NOTE — Anesthesia Postprocedure Evaluation (Signed)
Anesthesia Post Note  Patient: Lori Davidson  Procedure(s) Performed: Procedure(s) (LRB): COLONOSCOPY WITH PROPOFOL (N/A) ESOPHAGOGASTRODUODENOSCOPY (EGD) WITH PROPOFOL (N/A)  Patient location during evaluation: PACU Anesthesia Type: MAC Level of consciousness: awake and alert and oriented Pain management: satisfactory to patient Vital Signs Assessment: post-procedure vital signs reviewed and stable Respiratory status: spontaneous breathing, nonlabored ventilation and respiratory function stable Cardiovascular status: blood pressure returned to baseline and stable Postop Assessment: Adequate PO intake and No signs of nausea or vomiting Anesthetic complications: no    Raliegh Ip

## 2017-03-30 NOTE — Anesthesia Procedure Notes (Signed)
Procedure Name: MAC Performed by: Lind Guest Pre-anesthesia Checklist: Patient identified, Emergency Drugs available, Suction available, Patient being monitored and Timeout performed Patient Re-evaluated:Patient Re-evaluated prior to inductionOxygen Delivery Method: Nasal cannula

## 2017-03-30 NOTE — Op Note (Signed)
[facility] Gastroenterology Patient Name: Lori Davidson Procedure Date: 03/30/2017 8:43 AM MRN: [id number] Account #: [id number] Date of Birth: [birth year] Admit Type: Outpatient Age: 51 Room: [facility] OR ROOM 01 Gender: Female Note Status: Finalized Procedure:            Upper GI endoscopy Indications:          Heartburn Providers:            Lucilla Lame MD, MD Referring MD:         Jearld Fenton (Referring MD) Medicines:            Propofol per Anesthesia Complications:        No immediate complications. Procedure:            Pre-Anesthesia Assessment:                       - Prior to the procedure, a History and Physical was                        performed, and patient medications and allergies were                        reviewed. The patient's tolerance of previous                        anesthesia was also reviewed. The risks and benefits of                        the procedure and the sedation options and risks were                        discussed with the patient. All questions were                        answered, and informed consent was obtained. Prior                        Anticoagulants: The patient has taken no previous                        anticoagulant or antiplatelet agents. ASA Grade                        Assessment: II - A patient with mild systemic disease.                        After reviewing the risks and benefits, the patient was                        deemed in satisfactory condition to undergo the                        procedure.                       After obtaining informed consent, the endoscope was                        passed under direct vision. Throughout the procedure,  the patient's blood pressure, pulse, and oxygen                        saturations were monitored continuously. The Olympus                        190 Endoscope [id number]) was introduced through the                        mouth, and  advanced to the second part of duodenum. The                        upper GI endoscopy was accomplished without difficulty.                        The patient tolerated the procedure well. Findings:      Patchy candidiasis was found in the lower third of the esophagus. Cells       for cytology were obtained by brushing.      Localized mild inflammation characterized by erythema was found in the       gastric antrum. Biopsies were taken with a cold forceps for histology.      The examined duodenum was normal.      A small hiatal hernia was present. Impression:           - Suspected Monilial esophagitis. Cells for cytology                        obtained.                       - Gastritis. Biopsied.                       - Normal examined duodenum.                       - Small hiatal hernia. Recommendation:       - Await pathology results.                       - Discharge patient to home.                       - Resume previous diet.                       - Continue present medications.                       - Await pathology results. Procedure Code(s):    --- Professional ---                       [phone], Esophagogastroduodenoscopy, flexible, transoral;                        with biopsy, single or multiple Diagnosis Code(s):    --- Professional ---                       R12, Heartburn                       K29.70, Gastritis, unspecified, without bleeding  B37.81, Candidal esophagitis CPT copyright 2016 [employer]. All rights reserved. The codes documented in this report are preliminary and upon coder review may  be revised to meet current compliance requirements. Lucilla Lame MD, MD 03/30/2017 9:01:10 AM This report has been signed electronically. Number of Addenda: 0 Note Initiated On: 03/30/2017 8:43 AM Total Procedure Duration: 0 hours 5 minutes 19 seconds       [facility]

## 2017-03-30 NOTE — Op Note (Signed)
[facility] Patient Name: Lori Davidson Procedure Date: 03/30/2017 8:41 AM MRN: [id number] Account #: [id number] Date of Birth: 06/26/1966 Admit Type: Outpatient Age: 51 Room: [facility] OR ROOM 01 Gender: Female Note Status: Finalized Procedure:            Colonoscopy Indications:          Screening for colorectal malignant neoplasm Providers:            Lucilla Lame MD, MD Referring MD:         Jearld Fenton (Referring MD) Medicines:            Propofol per Anesthesia Complications:        No immediate complications. Procedure:            Pre-Anesthesia Assessment:                       - Prior to the procedure, a History and Physical was                        performed, and patient medications and allergies were                        reviewed. The patient's tolerance of previous                        anesthesia was also reviewed. The risks and benefits of                        the procedure and the sedation options and risks were                        discussed with the patient. All questions were                        answered, and informed consent was obtained. Prior                        Anticoagulants: The patient has taken no previous                        anticoagulant or antiplatelet agents. ASA Grade                        Assessment: II - A patient with mild systemic disease.                        After reviewing the risks and benefits, the patient was                        deemed in satisfactory condition to undergo the                        procedure.                       After obtaining informed consent, the colonoscope was                        passed under direct vision. Throughout the procedure,  the patient's blood pressure, pulse, and oxygen                        saturations were monitored continuously. The [place] [id number]) was introduced through the                    anus and advanced to the the cecum, identified by                        appendiceal orifice and ileocecal valve. The                        colonoscopy was performed without difficulty. The                        patient tolerated the procedure well. The quality of                        the bowel preparation was excellent. Findings:      The perianal and digital rectal examinations were normal.      Non-bleeding internal hemorrhoids were found during retroflexion. The       hemorrhoids were Grade I (internal hemorrhoids that do not prolapse). Impression:           - Non-bleeding internal hemorrhoids.                       - No specimens collected. Recommendation:       - Discharge patient to home.                       - Resume previous diet.                       - Continue present medications.                       - Repeat colonoscopy in 10 years for screening unless                        any change in family history or lower GI problems. Procedure Code(s):    --- Professional ---                       [phone], Colonoscopy, flexible; diagnostic, including                        collection of specimen(s) by brushing or washing, when                        performed (separate procedure) Diagnosis Code(s):    --- Professional ---                       Z12.11, Encounter for screening for malignant neoplasm                        of colon CPT copyright 2016 American Medical Association. All rights reserved. The codes documented in this report are preliminary and upon coder review may  be  revised to meet current compliance requirements. Lucilla Lame MD, MD 03/30/2017 9:16:07 AM This report has been signed electronically. Number of Addenda: 0 Note Initiated On: 03/30/2017 8:41 AM Scope Withdrawal Time: 0 hours 6 minutes 17 seconds  Total Procedure Duration: 0 hours 11 minutes 7 seconds       [facility]

## 2017-03-30 NOTE — Telephone Encounter (Signed)
03/30/16 NO prior auth required through automated system for EGD 43235 and Colonoscopy 45378 for Z12.11 and K21.9 Ref # [id number].

## 2017-03-30 NOTE — H&P (Addendum)
Lucilla Lame, MD [address] [place], [place] [address] Phone: [phone] Fax : [phone]  Primary Care Physician:  Webb Silversmith, NP Primary Gastroenterologist:  Dr. Allen Norris  Pre-Procedure History & Physical: HPI:  Lori Davidson is a 51 y.o. female is here for a screening colonoscopy and EGD.   Past Medical History:  Diagnosis Date  . Dysrhythmia    PAC's  . GERD (gastroesophageal reflux disease)   . H/O hiatal hernia   . Hyperthyroidism 2005   resolved after medication for one year  . Thyroid disease    Dx 2005    Past Surgical History:  Procedure Laterality Date  . BILATERAL SALPINGECTOMY Bilateral 01/02/2016   Procedure: BILATERAL SALPINGECTOMY;  Surgeon: [name], DO;  Location: [facility];  Service: Gynecology;  Laterality: Bilateral;  . IUD REMOVAL N/A 01/02/2016   Procedure: INTRAUTERINE DEVICE (IUD) REMOVAL;  Surgeon: [name], DO;  Location: [facility];  Service: Gynecology;  Laterality: N/A;  . LAPAROSCOPIC VAGINAL HYSTERECTOMY WITH SALPINGECTOMY Bilateral 01/02/2016   Procedure: LAPAROSCOPIC ASSISTED VAGINAL HYSTERECTOMY ;  Surgeon: [name], DO;  Location: [facility];  Service: Gynecology;  Laterality: Bilateral;    Prior to Admission medications   Medication Sig Start Date End Date Taking? Authorizing Provider  cetirizine (ZYRTEC) 10 MG tablet Take 10 mg by mouth every other day.    Yes Historical Provider, MD  cholecalciferol (VITAMIN D) 1000 units tablet Take 1,000 Units by mouth daily.   Yes Historical Provider, MD  Dexlansoprazole (DEXILANT PO) Take by mouth daily.   Yes Historical Provider, MD  diphenhydrAMINE (BENADRYL) 25 MG tablet Take 25 mg by mouth at bedtime as needed for itching.   Yes Historical Provider, MD  Multiple Vitamin (MULTIVITAMIN WITH MINERALS) TABS tablet Take 1 tablet by mouth daily.   Yes Historical Provider, MD  triamcinolone cream (KENALOG) 0.1 % Apply 1 application topically 2 (two) times daily as needed (itchy  skin).   Yes Historical Provider, MD  omeprazole (PRILOSEC) 40 MG capsule Take 1 capsule (40 mg total) by mouth daily. Patient not taking: Reported on 03/25/2017 12/30/16   Jearld Fenton, NP  sucralfate (CARAFATE) 1 g tablet Take 1 tablet (1 g total) by mouth 4 (four) times daily -  with meals and at bedtime. Patient not taking: Reported on 03/16/2017 01/02/17   Jearld Fenton, NP    Allergies as of 03/19/2017  . (No Known Allergies)    Family History  Problem Relation Age of Onset  . Breast cancer Mother   . Kidney cancer Maternal Grandmother   . Heart disease Maternal Grandfather   . Alzheimer's disease Maternal Grandfather     Social History   Social History  . Marital status: Married    Spouse name: N/A  . Number of children: N/A  . Years of education: N/A   Occupational History  . Not on file.   Social History Main Topics  . Smoking status: Never Smoker  . Smokeless tobacco: Never Used  . Alcohol use 2.4 oz/week    4 Glasses of wine per week     Comment:    . Drug use: No  . Sexual activity: Not Currently    Birth control/ protection: IUD   Other Topics Concern  . Not on file   Social History Narrative  . No narrative on file    Review of Systems: See HPI, otherwise negative ROS  Physical Exam: BP (!) 121/92   Pulse 71   Temp 97.7 F (36.5  C) (Tympanic)   Resp 16   Ht 5\' 5"  (1.651 m)   Wt 167 lb (75.8 kg)   LMP 08/21/2015   SpO2 99%   BMI 27.79 kg/m  General:   Alert,  pleasant and cooperative in NAD Head:  Normocephalic and atraumatic. Neck:  Supple; no masses or thyromegaly. Lungs:  Clear throughout to auscultation.    Heart:  Regular rate and rhythm. Abdomen:  Soft, nontender and nondistended. Normal bowel sounds, without guarding, and without rebound.   Neurologic:  Alert and  oriented x4;  grossly normal neurologically.  Impression/Plan: Lori Davidson is now here to undergo a screening colonoscopy and EGD for GERD.  Risks, benefits, and  alternatives regarding colonoscopy and EGD have been reviewed with the patient.  Questions have been answered.  All parties agreeable.

## 2017-03-30 NOTE — H&P (Signed)
Lucilla Lame, MD [address] [place],  [address] Phone: [phone] Fax : [phone]  Primary Care Physician:  Webb Silversmith, NP Primary Gastroenterologist:  Dr. Allen Norris  Pre-Procedure History & Physical: HPI:  Lori Davidson is a 51 y.o. female is here for a screening colonoscopy.   Past Medical History:  Diagnosis Date  . Dysrhythmia    PAC's  . GERD (gastroesophageal reflux disease)   . H/O hiatal hernia   . Hyperthyroidism 2005   resolved after medication for one year  . Thyroid disease    Dx 2005    Past Surgical History:  Procedure Laterality Date  . BILATERAL SALPINGECTOMY Bilateral 01/02/2016   Procedure: BILATERAL SALPINGECTOMY;  Surgeon: [name], DO;  Location: [facility];  Service: Gynecology;  Laterality: Bilateral;  . IUD REMOVAL N/A 01/02/2016   Procedure: INTRAUTERINE DEVICE (IUD) REMOVAL;  Surgeon: [name], DO;  Location: [facility];  Service: Gynecology;  Laterality: N/A;  . LAPAROSCOPIC VAGINAL HYSTERECTOMY WITH SALPINGECTOMY Bilateral 01/02/2016   Procedure: LAPAROSCOPIC ASSISTED VAGINAL HYSTERECTOMY ;  Surgeon: [name], DO;  Location: [facility];  Service: Gynecology;  Laterality: Bilateral;    Prior to Admission medications   Medication Sig Start Date End Date Taking? Authorizing Provider  cetirizine (ZYRTEC) 10 MG tablet Take 10 mg by mouth every other day.    Yes Historical Provider, MD  cholecalciferol (VITAMIN D) 1000 units tablet Take 1,000 Units by mouth daily.   Yes Historical Provider, MD  Dexlansoprazole (DEXILANT PO) Take by mouth daily.   Yes Historical Provider, MD  diphenhydrAMINE (BENADRYL) 25 MG tablet Take 25 mg by mouth at bedtime as needed for itching.   Yes Historical Provider, MD  Multiple Vitamin (MULTIVITAMIN WITH MINERALS) TABS tablet Take 1 tablet by mouth daily.   Yes Historical Provider, MD  triamcinolone cream (KENALOG) 0.1 % Apply 1 application topically 2 (two) times daily as needed (itchy skin).   Yes  Historical Provider, MD  omeprazole (PRILOSEC) 40 MG capsule Take 1 capsule (40 mg total) by mouth daily. Patient not taking: Reported on 03/25/2017 12/30/16   Jearld Fenton, NP  sucralfate (CARAFATE) 1 g tablet Take 1 tablet (1 g total) by mouth 4 (four) times daily -  with meals and at bedtime. Patient not taking: Reported on 03/16/2017 01/02/17   Jearld Fenton, NP    Allergies as of 03/19/2017  . (No Known Allergies)    Family History  Problem Relation Age of Onset  . Breast cancer Mother   . Kidney cancer Maternal Grandmother   . Heart disease Maternal Grandfather   . Alzheimer's disease Maternal Grandfather     Social History   Social History  . Marital status: Married    Spouse name: N/A  . Number of children: N/A  . Years of education: N/A   Occupational History  . Not on file.   Social History Main Topics  . Smoking status: Never Smoker  . Smokeless tobacco: Never Used  . Alcohol use 2.4 oz/week    4 Glasses of wine per week     Comment:    . Drug use: No  . Sexual activity: Not Currently    Birth control/ protection: IUD   Other Topics Concern  . Not on file   Social History Narrative  . No narrative on file    Review of Systems: See HPI, otherwise negative ROS  Physical Exam: BP (!) 121/92   Pulse 71   Temp 97.7 F (36.5 C) (Tympanic)  Resp 16   Ht 5\' 5"  (1.651 m)   Wt 167 lb (75.8 kg)   LMP 08/21/2015   SpO2 99%   BMI 27.79 kg/m  General:   Alert,  pleasant and cooperative in NAD Head:  Normocephalic and atraumatic. Neck:  Supple; no masses or thyromegaly. Lungs:  Clear throughout to auscultation.    Heart:  Regular rate and rhythm. Abdomen:  Soft, nontender and nondistended. Normal bowel sounds, without guarding, and without rebound.   Neurologic:  Alert and  oriented x4;  grossly normal neurologically.  Impression/Plan: Lori Davidson is now here to undergo a screening colonoscopy.  Risks, benefits, and alternatives regarding  colonoscopy have been reviewed with the patient.  Questions have been answered.  All parties agreeable.

## 2017-03-31 ENCOUNTER — Encounter: Payer: Self-pay | Admitting: Gastroenterology

## 2017-04-03 ENCOUNTER — Encounter: Payer: Self-pay | Admitting: Gastroenterology

## 2017-04-06 ENCOUNTER — Telehealth: Payer: Self-pay

## 2017-04-06 ENCOUNTER — Other Ambulatory Visit: Payer: Self-pay

## 2017-04-06 MED ORDER — FLUCONAZOLE 100 MG PO TABS: 100.0000 mg | ORAL_TABLET | Freq: Every day | ORAL | 0 refills | Status: AC

## 2017-04-06 NOTE — Telephone Encounter (Signed)
Left vm for pt to return my call for EGD results. Rx for diflucan has been sent to [facility].

## 2017-04-06 NOTE — Telephone Encounter (Signed)
-----   Message from Lucilla Lame, MD sent at 04/06/2017  7:50 AM EDT ----- Let the patient know the pathology of the EGD showed a yeast infection. She should be treated with Diflucan 100mg  qd for 2 weeks

## 2017-04-06 NOTE — Telephone Encounter (Signed)
Pt returned call and was given EGD results.

## 2017-04-21 ENCOUNTER — Other Ambulatory Visit: Payer: Self-pay | Admitting: Internal Medicine

## 2017-06-02 ENCOUNTER — Encounter: Payer: Self-pay | Admitting: Internal Medicine

## 2017-06-02 ENCOUNTER — Ambulatory Visit (INDEPENDENT_AMBULATORY_CARE_PROVIDER_SITE_OTHER): Payer: BLUE CROSS/BLUE SHIELD | Admitting: Internal Medicine

## 2017-06-02 VITALS — BP 110/76 | HR 72 | Temp 98.4°F | Wt 171.0 lb

## 2017-06-02 DIAGNOSIS — K219 Gastro-esophageal reflux disease without esophagitis: Secondary | ICD-10-CM | POA: Diagnosis not present

## 2017-06-02 DIAGNOSIS — J029 Acute pharyngitis, unspecified: Secondary | ICD-10-CM

## 2017-06-02 DIAGNOSIS — L237 Allergic contact dermatitis due to plants, except food: Secondary | ICD-10-CM | POA: Diagnosis not present

## 2017-06-02 MED ORDER — SUCRALFATE 1 G PO TABS: 1.0000 g | ORAL_TABLET | Freq: Three times a day (TID) | ORAL | 0 refills | Status: DC

## 2017-06-02 MED ORDER — METHYLPREDNISOLONE ACETATE 80 MG/ML IJ SUSP
80.0000 mg | Freq: Once | INTRAMUSCULAR | Status: AC
  Administered 2017-06-02: 80 mg via INTRAMUSCULAR

## 2017-06-02 NOTE — Progress Notes (Signed)
Subjective:    Patient ID: Lori Davidson, female    DOB: Apr 19, 1966, 51 y.o.   MRN: [id number]  HPI  Pt presents to the clinic today with c/o a rash on her right arm and bilateral legs. She noticed this 1 week ago. The rash itches. She recently travelled to [place]. She did stay in a hotel while she was there. She was doing activities in the woods, and was around different animals. She has tried Triamcinolone with minimal relief.  She also c/o constant sore throat. She denies difficulty swallowing. She reports associated hoarseness. She denies post nasal drip or cough. She does have intermittent heartburn and takes Zantac daily. She reports the Zantac does not always cut it, so she takes Carafate as needed with good relief. She would like a refill of the Carafate today.  Review of Systems  Past Medical History:  Diagnosis Date  . Dysrhythmia    PAC's  . GERD (gastroesophageal reflux disease)   . H/O hiatal hernia   . Hyperthyroidism 2005   resolved after medication for one year  . Thyroid disease    Dx 2005    Current Outpatient Prescriptions  Medication Sig Dispense Refill  . cetirizine (ZYRTEC) 10 MG tablet Take 10 mg by mouth every other day.     . cholecalciferol (VITAMIN D) 1000 units tablet Take 1,000 Units by mouth daily.    . diphenhydrAMINE (BENADRYL) 25 MG tablet Take 25 mg by mouth at bedtime as needed for itching.    . Multiple Vitamin (MULTIVITAMIN WITH MINERALS) TABS tablet Take 1 tablet by mouth daily.    . ranitidine (ZANTAC) 150 MG tablet Take 150 mg by mouth daily.    . sucralfate (CARAFATE) 1 g tablet Take 1 tablet (1 g total) by mouth 4 (four) times daily -  with meals and at bedtime. 60 tablet 0  . triamcinolone cream (KENALOG) 0.1 % Apply 1 application topically 2 (two) times daily as needed (itchy skin).     No current facility-administered medications for this visit.     No Known Allergies  Family History  Problem Relation Age of Onset  . Breast  cancer Mother   . Kidney cancer Maternal Grandmother   . Heart disease Maternal Grandfather   . Alzheimer's disease Maternal Grandfather     Social History   Social History  . Marital status: Married    Spouse name: N/A  . Number of children: N/A  . Years of education: N/A   Occupational History  . Not on file.   Social History Main Topics  . Smoking status: Never Smoker  . Smokeless tobacco: Never Used  . Alcohol use 2.4 oz/week    4 Glasses of wine per week     Comment:    . Drug use: No  . Sexual activity: Not Currently    Birth control/ protection: IUD   Other Topics Concern  . Not on file   Social History Narrative  . No narrative on file     Constitutional: Denies fever, malaise, fatigue, headache or abrupt weight changes.  HEENT: Pt reports sore throat and hoarseness. Denies eye pain, eye redness, ear pain, ringing in the ears, wax buildup, runny nose, nasal congestion, bloody nose. Respiratory: Denies difficulty breathing, shortness of breath, cough or sputum production.   Cardiovascular: Denies chest pain, chest tightness, palpitations or swelling in the hands or feet.  Gastrointestinal: Pt reports intermittent reflux. Denies abdominal pain, bloating, constipation, diarrhea or blood in the stool.  Skin: Pt reports rash. Denies lesions or ulcercations.    No other specific complaints in a complete review of systems (except as listed in HPI above).     Objective:   Physical Exam BP 110/76   Pulse 72   Temp 98.4 F (36.9 C) (Oral)   Wt 171 lb (77.6 kg)   LMP 08/21/2015   SpO2 98%   BMI 28.46 kg/m  Wt Readings from Last 3 Encounters:  06/02/17 171 lb (77.6 kg)  03/30/17 167 lb (75.8 kg)  03/16/17 171 lb (77.6 kg)    General: Appears her stated age, well developed, well nourished in NAD. Skin: Linear weeping, crusted lesions noted on right forearm and bilateral anterior legs. HEENT: Throat/Mouth: Teeth present, mucosa pink and moist, no exudate,  lesions or ulcerations noted.  Neck:  No adenopathy noted.  Pulmonary/Chest: Normal effort and positive vesicular breath sounds. No respiratory distress. No wheezes, rales or ronchi noted.  Abdomen: Soft and nontender. Normal bowel sounds. No distention or masses noted.    BMET    Component Value Date/Time   NA 134 (L) 03/09/2016 1602   K 3.8 03/09/2016 1602   CL 101 03/09/2016 1602   CO2 25 03/09/2016 1602   GLUCOSE 101 (H) 03/09/2016 1602   BUN 13 03/09/2016 1602   CREATININE 0.62 03/09/2016 1602   CALCIUM 9.6 03/09/2016 1602   GFRNONAA >60 03/09/2016 1602   GFRAA >60 03/09/2016 1602    Lipid Panel  No results found for: CHOL, TRIG, HDL, CHOLHDL, VLDL, LDLCALC  CBC    Component Value Date/Time   WBC 8.3 03/09/2016 1602   RBC 4.59 03/09/2016 1602   HGB 14.8 03/09/2016 1602   HCT 42.8 03/09/2016 1602   PLT 257 03/09/2016 1602   MCV 93.3 03/09/2016 1602   MCH 32.2 03/09/2016 1602   MCHC 34.5 03/09/2016 1602   RDW 12.5 03/09/2016 1602    Hgb A1C No results found for: HGBA1C           Assessment & Plan:   Poison Ivy:  80 mg Depo IM today Continue Triamcinolone Cream BID Can use Calamine Lotion as needed  Sore Throat secondary to Uncontrolled Reflux:  Change Zantac to QHS Try Prilosec OTC x 14 days Carafate refilled today  Advised her to follow up with me in 2 weeks to let me know how the Prilosec worked Webb Silversmith, NP

## 2017-06-02 NOTE — Addendum Note (Signed)
Addended by: Lurlean Nanny on: 06/02/2017 11:48 AM   Modules accepted: Orders

## 2017-06-02 NOTE — Patient Instructions (Signed)
Poison Ivy Dermatitis Poison ivy dermatitis is redness and soreness (inflammation) of the skin. It is caused by a chemical that is found on the leaves of the poison ivy plant. You may also have itching, a rash, and blisters. Symptoms often clear up in 1-2 weeks. You may get this condition by touching a poison ivy plant. You can also get it by touching something that has the chemical on it. This may include animals or objects that have come in contact with the plant. Follow these instructions at home: General instructions  Take or apply over-the-counter and prescription medicines only as told by your doctor.  If you touch poison ivy, wash your skin with soap and cold water right away.  Use hydrocortisone creams or calamine lotion as needed to help with itching.  Take oatmeal baths as needed. Use colloidal oatmeal. You can get this at a pharmacy or grocery store. Follow the instructions on the package.  Do not scratch or rub your skin.  While you have the rash, wash your clothes right after you wear them. Prevention  Know what poison ivy looks like so you can avoid it. This plant has three leaves with flowering branches on a single stem. The leaves are glossy. They have uneven edges that come to a point at the front.  If you have touched poison ivy, wash with soap and water right away. Be sure to wash under your fingernails.  When hiking or camping, wear long pants, a long-sleeved shirt, tall socks, and hiking boots. You can also use a lotion on your skin that helps to prevent contact with the chemical on the plant.  If you think that your clothes or outdoor gear came in contact with poison ivy, rinse them off with a garden hose before you bring them inside your house. Contact a doctor if:  You have open sores in the rash area.  You have more redness, swelling, or pain in the affected area.  You have redness that spreads beyond the rash area.  You have fluid, blood, or pus coming from  the affected area.  You have a fever.  You have a rash over a large area of your body.  You have a rash on your eyes, mouth, or genitals.  Your rash does not get better after a few days. Get help right away if:  Your face swells or your eyes swell shut.  You have trouble breathing.  You have trouble swallowing. This information is not intended to replace advice given to you by your health care provider. Make sure you discuss any questions you have with your health care provider. Document Released: 01/03/2011 Document Revised: 05/08/2016 Document Reviewed: 05/09/2015 Elsevier Interactive Patient Education  2018 [employer]  

## 2017-07-13 ENCOUNTER — Other Ambulatory Visit: Payer: Self-pay | Admitting: Internal Medicine

## 2017-07-13 NOTE — Telephone Encounter (Signed)
Last filled 06/02/17... Please advise

## 2017-07-20 DIAGNOSIS — Z113 Encounter for screening for infections with a predominantly sexual mode of transmission: Secondary | ICD-10-CM | POA: Diagnosis not present

## 2017-07-20 DIAGNOSIS — Z01419 Encounter for gynecological examination (general) (routine) without abnormal findings: Secondary | ICD-10-CM | POA: Diagnosis not present

## 2017-08-04 ENCOUNTER — Other Ambulatory Visit: Payer: Self-pay | Admitting: Obstetrics & Gynecology

## 2017-08-04 DIAGNOSIS — Z1231 Encounter for screening mammogram for malignant neoplasm of breast: Secondary | ICD-10-CM

## 2017-08-11 ENCOUNTER — Ambulatory Visit
Admission: RE | Admit: 2017-08-11 | Discharge: 2017-08-11 | Disposition: A | Discharge status: Home / Self Care | Payer: BLUE CROSS/BLUE SHIELD | Source: Ambulatory Visit | Attending: Obstetrics & Gynecology | Admitting: Obstetrics & Gynecology

## 2017-08-11 DIAGNOSIS — Z1231 Encounter for screening mammogram for malignant neoplasm of breast: Secondary | ICD-10-CM | POA: Diagnosis not present

## 2017-08-24 ENCOUNTER — Encounter: Payer: Self-pay | Admitting: Internal Medicine

## 2017-08-25 ENCOUNTER — Ambulatory Visit (INDEPENDENT_AMBULATORY_CARE_PROVIDER_SITE_OTHER): Payer: BLUE CROSS/BLUE SHIELD | Admitting: Internal Medicine

## 2017-08-25 ENCOUNTER — Encounter: Payer: Self-pay | Admitting: Internal Medicine

## 2017-08-25 VITALS — BP 110/70 | HR 65 | Temp 97.9°F | Wt 172.8 lb

## 2017-08-25 DIAGNOSIS — L237 Allergic contact dermatitis due to plants, except food: Secondary | ICD-10-CM

## 2017-08-25 MED ORDER — CLOBETASOL PROPIONATE 0.05 % EX OINT: 1.0000 "application " | TOPICAL_OINTMENT | Freq: Two times a day (BID) | CUTANEOUS | 0 refills | Status: DC

## 2017-08-25 NOTE — Patient Instructions (Signed)
Poison Ivy Dermatitis Poison ivy dermatitis is redness and soreness (inflammation) of the skin. It is caused by a chemical that is found on the leaves of the poison ivy plant. You may also have itching, a rash, and blisters. Symptoms often clear up in 1-2 weeks. You may get this condition by touching a poison ivy plant. You can also get it by touching something that has the chemical on it. This may include animals or objects that have come in contact with the plant. Follow these instructions at home: General instructions  Take or apply over-the-counter and prescription medicines only as told by your doctor.  If you touch poison ivy, wash your skin with soap and cold water right away.  Use hydrocortisone creams or calamine lotion as needed to help with itching.  Take oatmeal baths as needed. Use colloidal oatmeal. You can get this at a pharmacy or grocery store. Follow the instructions on the package.  Do not scratch or rub your skin.  While you have the rash, wash your clothes right after you wear them. Prevention  Know what poison ivy looks like so you can avoid it. This plant has three leaves with flowering branches on a single stem. The leaves are glossy. They have uneven edges that come to a point at the front.  If you have touched poison ivy, wash with soap and water right away. Be sure to wash under your fingernails.  When hiking or camping, wear long pants, a long-sleeved shirt, tall socks, and hiking boots. You can also use a lotion on your skin that helps to prevent contact with the chemical on the plant.  If you think that your clothes or outdoor gear came in contact with poison ivy, rinse them off with a garden hose before you bring them inside your house. Contact a doctor if:  You have open sores in the rash area.  You have more redness, swelling, or pain in the affected area.  You have redness that spreads beyond the rash area.  You have fluid, blood, or pus coming from  the affected area.  You have a fever.  You have a rash over a large area of your body.  You have a rash on your eyes, mouth, or genitals.  Your rash does not get better after a few days. Get help right away if:  Your face swells or your eyes swell shut.  You have trouble breathing.  You have trouble swallowing. This information is not intended to replace advice given to you by your health care provider. Make sure you discuss any questions you have with your health care provider. Document Released: 01/03/2011 Document Revised: 05/08/2016 Document Reviewed: 05/09/2015 Elsevier Interactive Patient Education  2018 [employer]  

## 2017-08-25 NOTE — Progress Notes (Signed)
Subjective:    Patient ID: Lori Davidson, female    DOB: 1966-05-07, 51 y.o.   MRN: [id number]  HPI  Pt presents to the clinic today with c/o a rash on her right forearm. She noticed this 1-2 weeks ago. The rash itches and burns. It has not spread. She has not come in contact with anything that she is allergic to. She thinks it may be poison ivy. Her dogs are indoor/outdoor. She denies changes in soap, lotions and detergents. She has tried Triamcinolone Cream with minimal relief.  Review of Systems  Past Medical History:  Diagnosis Date  . Dysrhythmia    PAC's  . GERD (gastroesophageal reflux disease)   . H/O hiatal hernia   . Hyperthyroidism 2005   resolved after medication for one year  . Thyroid disease    Dx 2005    Current Outpatient Prescriptions  Medication Sig Dispense Refill  . cetirizine (ZYRTEC) 10 MG tablet Take 10 mg by mouth every other day.     . cholecalciferol (VITAMIN D) 1000 units tablet Take 1,000 Units by mouth daily.    . diphenhydrAMINE (BENADRYL) 25 MG tablet Take 25 mg by mouth at bedtime as needed for itching.    . Multiple Vitamin (MULTIVITAMIN WITH MINERALS) TABS tablet Take 1 tablet by mouth daily.    Marland Kitchen omeprazole (PRILOSEC OTC) 20 MG tablet Take 20 mg by mouth daily.    . sucralfate (CARAFATE) 1 g tablet TAKE 1 TABLET BY MOUTH 4 (FOUR) TIMES DAILY WITH MEALS AND AT BED TIME 60 tablet 2  . triamcinolone cream (KENALOG) 0.1 % Apply 1 application topically 2 (two) times daily as needed (itchy skin).    . clobetasol ointment (TEMOVATE) 2.67 % Apply 1 application topically 2 (two) times daily. 30 g 0   No current facility-administered medications for this visit.     No Known Allergies  Family History  Problem Relation Age of Onset  . Breast cancer Mother   . Kidney cancer Maternal Grandmother   . Heart disease Maternal Grandfather   . Alzheimer's disease Maternal Grandfather     Social History   Social History  . Marital status: Married   Spouse name: N/A  . Number of children: N/A  . Years of education: N/A   Occupational History  . Not on file.   Social History Main Topics  . Smoking status: Never Smoker  . Smokeless tobacco: Never Used  . Alcohol use 2.4 oz/week    4 Glasses of wine per week     Comment:    . Drug use: No  . Sexual activity: Not Currently    Birth control/ protection: IUD   Other Topics Concern  . Not on file   Social History Narrative  . No narrative on file     Constitutional: Denies fever, malaise, fatigue, headache or abrupt weight changes.  Skin: Pt reports rash on right forearm. Denies lesions or ulcercations.    No other specific complaints in a complete review of systems (except as listed in HPI above).     Objective:   Physical Exam   BP 110/70   Pulse 65   Temp 97.9 F (36.6 C) (Oral)   Wt 172 lb 12 oz (78.4 kg)   LMP 08/21/2015   SpO2 98%   BMI 28.75 kg/m  Wt Readings from Last 3 Encounters:  08/25/17 172 lb 12 oz (78.4 kg)  06/02/17 171 lb (77.6 kg)  03/30/17 167 lb (75.8 kg)  General: Appears her stated age, well developed, well nourished in NAD. Skin: Vesicular lesion on erythematous base in linear pattern noted on medial right forearm.  BMET    Component Value Date/Time   NA 134 (L) 03/09/2016 1602   K 3.8 03/09/2016 1602   CL 101 03/09/2016 1602   CO2 25 03/09/2016 1602   GLUCOSE 101 (H) 03/09/2016 1602   BUN 13 03/09/2016 1602   CREATININE 0.62 03/09/2016 1602   CALCIUM 9.6 03/09/2016 1602   GFRNONAA >60 03/09/2016 1602   GFRAA >60 03/09/2016 1602    Lipid Panel  No results found for: CHOL, TRIG, HDL, CHOLHDL, VLDL, LDLCALC  CBC    Component Value Date/Time   WBC 8.3 03/09/2016 1602   RBC 4.59 03/09/2016 1602   HGB 14.8 03/09/2016 1602   HCT 42.8 03/09/2016 1602   PLT 257 03/09/2016 1602   MCV 93.3 03/09/2016 1602   MCH 32.2 03/09/2016 1602   MCHC 34.5 03/09/2016 1602   RDW 12.5 03/09/2016 1602    Hgb A1C No results found for:  HGBA1C         Assessment & Plan:   Poison Ivy:  Wash the are with warm water and soap 80 mg Depo IM today eRx for Clobetasol Cream BID until resolved  Return precautions discussed Webb Silversmith, NP

## 2017-08-26 MED ORDER — METHYLPREDNISOLONE ACETATE 80 MG/ML IJ SUSP
80.0000 mg | Freq: Once | INTRAMUSCULAR | Status: AC
  Administered 2017-08-25: 80 mg via INTRAMUSCULAR

## 2017-08-26 NOTE — Addendum Note (Signed)
Addended by: Lurlean Nanny on: 08/26/2017 10:05 AM   Modules accepted: Orders

## 2018-02-17 DIAGNOSIS — D1801 Hemangioma of skin and subcutaneous tissue: Secondary | ICD-10-CM | POA: Diagnosis not present

## 2018-02-17 DIAGNOSIS — L820 Inflamed seborrheic keratosis: Secondary | ICD-10-CM | POA: Diagnosis not present

## 2018-02-17 DIAGNOSIS — L821 Other seborrheic keratosis: Secondary | ICD-10-CM | POA: Diagnosis not present

## 2018-02-17 DIAGNOSIS — D2372 Other benign neoplasm of skin of left lower limb, including hip: Secondary | ICD-10-CM | POA: Diagnosis not present

## 2018-02-17 DIAGNOSIS — L814 Other melanin hyperpigmentation: Secondary | ICD-10-CM | POA: Diagnosis not present

## 2018-09-06 ENCOUNTER — Other Ambulatory Visit: Payer: Self-pay | Admitting: Obstetrics & Gynecology

## 2018-09-06 DIAGNOSIS — Z1231 Encounter for screening mammogram for malignant neoplasm of breast: Secondary | ICD-10-CM

## 2018-10-11 ENCOUNTER — Ambulatory Visit
Admission: RE | Admit: 2018-10-11 | Discharge: 2018-10-11 | Disposition: A | Discharge status: Home / Self Care | Payer: 59 | Source: Ambulatory Visit | Attending: Obstetrics & Gynecology | Admitting: Obstetrics & Gynecology

## 2018-10-11 DIAGNOSIS — Z1231 Encounter for screening mammogram for malignant neoplasm of breast: Secondary | ICD-10-CM

## 2019-02-01 ENCOUNTER — Ambulatory Visit (INDEPENDENT_AMBULATORY_CARE_PROVIDER_SITE_OTHER): Payer: 59 | Admitting: Internal Medicine

## 2019-02-01 VITALS — BP 112/74 | HR 70 | Temp 97.9°F | Ht 65.5 in | Wt 175.0 lb

## 2019-02-01 DIAGNOSIS — Z0001 Encounter for general adult medical examination with abnormal findings: Secondary | ICD-10-CM | POA: Diagnosis not present

## 2019-02-01 DIAGNOSIS — Z23 Encounter for immunization: Secondary | ICD-10-CM | POA: Diagnosis not present

## 2019-02-01 DIAGNOSIS — K219 Gastro-esophageal reflux disease without esophagitis: Secondary | ICD-10-CM | POA: Diagnosis not present

## 2019-02-01 DIAGNOSIS — I499 Cardiac arrhythmia, unspecified: Secondary | ICD-10-CM | POA: Diagnosis not present

## 2019-02-01 NOTE — Patient Instructions (Signed)
\Health Maintenance for Postmenopausal Women Menopause is a normal process in which your reproductive ability comes to an end. This process happens gradually over a span of months to years, usually between the ages of 45 and 64. Menopause is complete when you have missed 12 consecutive menstrual periods. It is important to talk with your health care provider about some of the most common conditions that affect postmenopausal women, such as heart disease, cancer, and bone loss (osteoporosis). Adopting a healthy lifestyle and getting preventive care can help to promote your health and wellness. Those actions can also lower your chances of developing some of these common conditions. What should I know about menopause? During menopause, you may experience a number of symptoms, such as:  Moderate-to-severe hot flashes.  Night sweats.  Decrease in sex drive.  Mood swings.  Headaches.  Tiredness.  Irritability.  Memory problems.  Insomnia. Choosing to treat or not to treat menopausal changes is an individual decision that you make with your health care provider. What should I know about hormone replacement therapy and supplements? Hormone therapy products are effective for treating symptoms that are associated with menopause, such as hot flashes and night sweats. Hormone replacement carries certain risks, especially as you become older. If you are thinking about using estrogen or estrogen with progestin treatments, discuss the benefits and risks with your health care provider. What should I know about heart disease and stroke? Heart disease, heart attack, and stroke become more likely as you age. This may be due, in part, to the hormonal changes that your body experiences during menopause. These can affect how your body processes dietary fats, triglycerides, and cholesterol. Heart attack and stroke are both medical emergencies. There are many things that you can do to help prevent heart disease  and stroke:  Have your blood pressure checked at least every 1-2 years. High blood pressure causes heart disease and increases the risk of stroke.  If you are 29-56 years old, ask your health care provider if you should take aspirin to prevent a heart attack or a stroke.  Do not use any tobacco products, including cigarettes, chewing tobacco, or electronic cigarettes. If you need help quitting, ask your health care provider.  It is important to eat a healthy diet and maintain a healthy weight. ? Be sure to include plenty of vegetables, fruits, low-fat dairy products, and lean protein. ? Avoid eating foods that are high in solid fats, added sugars, or salt (sodium).  Get regular exercise. This is one of the most important things that you can do for your health. ? Try to exercise for at least 150 minutes each week. The type of exercise that you do should increase your heart rate and make you sweat. This is known as moderate-intensity exercise. ? Try to do strengthening exercises at least twice each week. Do these in addition to the moderate-intensity exercise.  Know your numbers.Ask your health care provider to check your cholesterol and your blood glucose. Continue to have your blood tested as directed by your health care provider.  What should I know about cancer screening? There are several types of cancer. Take the following steps to reduce your risk and to catch any cancer development as early as possible. Breast Cancer  Practice breast self-awareness. ? This means understanding how your breasts normally appear and feel. ? It also means doing regular breast self-exams. Let your health care provider know about any changes, no matter how small.  If you are 40 or  older, have a clinician do a breast exam (clinical breast exam or CBE) every year. Depending on your age, family history, and medical history, it may be recommended that you also have a yearly breast X-ray (mammogram).  If you  have a family history of breast cancer, talk with your health care provider about genetic screening.  If you are at high risk for breast cancer, talk with your health care provider about having an MRI and a mammogram every year.  Breast cancer (BRCA) gene test is recommended for women who have family members with BRCA-related cancers. Results of the assessment will determine the need for genetic counseling and BRCA1 and for BRCA2 testing. BRCA-related cancers include these types: ? Breast. This occurs in males or females. ? Ovarian. ? Tubal. This may also be called fallopian tube cancer. ? Cancer of the abdominal or pelvic lining (peritoneal cancer). ? Prostate. ? Pancreatic. Cervical, Uterine, and Ovarian Cancer Your health care provider may recommend that you be screened regularly for cancer of the pelvic organs. These include your ovaries, uterus, and vagina. This screening involves a pelvic exam, which includes checking for microscopic changes to the surface of your cervix (Pap test).  For women ages 21-65, health care providers may recommend a pelvic exam and a Pap test every three years. For women ages 68-65, they may recommend the Pap test and pelvic exam, combined with testing for human papilloma virus (HPV), every five years. Some types of HPV increase your risk of cervical cancer. Testing for HPV may also be done on women of any age who have unclear Pap test results.  Other health care providers may not recommend any screening for nonpregnant women who are considered low risk for pelvic cancer and have no symptoms. Ask your health care provider if a screening pelvic exam is right for you.  If you have had past treatment for cervical cancer or a condition that could lead to cancer, you need Pap tests and screening for cancer for at least 20 years after your treatment. If Pap tests have been discontinued for you, your risk factors (such as having a new sexual partner) need to be reassessed  to determine if you should start having screenings again. Some women have medical problems that increase the chance of getting cervical cancer. In these cases, your health care provider may recommend that you have screening and Pap tests more often.  If you have a family history of uterine cancer or ovarian cancer, talk with your health care provider about genetic screening.  If you have vaginal bleeding after reaching menopause, tell your health care provider.  There are currently no reliable tests available to screen for ovarian cancer. Lung Cancer Lung cancer screening is recommended for adults 68-54 years old who are at high risk for lung cancer because of a history of smoking. A yearly low-dose CT scan of the lungs is recommended if you:  Currently smoke.  Have a history of at least 30 pack-years of smoking and you currently smoke or have quit within the past 15 years. A pack-year is smoking an average of one pack of cigarettes per day for one year. Yearly screening should:  Continue until it has been 15 years since you quit.  Stop if you develop a health problem that would prevent you from having lung cancer treatment. Colorectal Cancer  This type of cancer can be detected and can often be prevented.  Routine colorectal cancer screening usually begins at age 55 and continues through  age 75.  If you have risk factors for colon cancer, your health care provider may recommend that you be screened at an earlier age.  If you have a family history of colorectal cancer, talk with your health care provider about genetic screening.  Your health care provider may also recommend using home test kits to check for hidden blood in your stool.  A small camera at the end of a tube can be used to examine your colon directly (sigmoidoscopy or colonoscopy). This is done to check for the earliest forms of colorectal cancer.  Direct examination of the colon should be repeated every 5-10 years until  age 75. However, if early forms of precancerous polyps or small growths are found or if you have a family history or genetic risk for colorectal cancer, you may need to be screened more often. Skin Cancer  Check your skin from head to toe regularly.  Monitor any moles. Be sure to tell your health care provider: ? About any new moles or changes in moles, especially if there is a change in a mole's shape or color. ? If you have a mole that is larger than the size of a pencil eraser.  If any of your family members has a history of skin cancer, especially at a young age, talk with your health care provider about genetic screening.  Always use sunscreen. Apply sunscreen liberally and repeatedly throughout the day.  Whenever you are outside, protect yourself by wearing long sleeves, pants, a wide-brimmed hat, and sunglasses. What should I know about osteoporosis? Osteoporosis is a condition in which bone destruction happens more quickly than new bone creation. After menopause, you may be at an increased risk for osteoporosis. To help prevent osteoporosis or the bone fractures that can happen because of osteoporosis, the following is recommended:  If you are 19-50 years old, get at least 1,000 mg of calcium and at least 600 mg of vitamin D per day.  If you are older than age 50 but younger than age 70, get at least 1,200 mg of calcium and at least 600 mg of vitamin D per day.  If you are older than age 70, get at least 1,200 mg of calcium and at least 800 mg of vitamin D per day. Smoking and excessive alcohol intake increase the risk of osteoporosis. Eat foods that are rich in calcium and vitamin D, and do weight-bearing exercises several times each week as directed by your health care provider. What should I know about how menopause affects my mental health? Depression may occur at any age, but it is more common as you become older. Common symptoms of depression include:  Low or sad  mood.  Changes in sleep patterns.  Changes in appetite or eating patterns.  Feeling an overall lack of motivation or enjoyment of activities that you previously enjoyed.  Frequent crying spells. Talk with your health care provider if you think that you are experiencing depression. What should I know about immunizations? It is important that you get and maintain your immunizations. These include:  Tetanus, diphtheria, and pertussis (Tdap) booster vaccine.  Influenza every year before the flu season begins.  Pneumonia vaccine.  Shingles vaccine. Your health care provider may also recommend other immunizations. This information is not intended to replace advice given to you by your health care provider. Make sure you discuss any questions you have with your health care provider. Document Released: 01/23/2006 Document Revised: 06/20/2016 Document Reviewed: 09/04/2015 Elsevier Interactive Patient Education    2019 [place].

## 2019-02-02 LAB — LIPID PANEL
CHOL/HDL RATIO: 3
CHOLESTEROL: 161 mg/dL (ref 0–200)
HDL: 56.8 mg/dL (ref >39.00)
LDL CALC: 78 mg/dL (ref 0–99)
NonHDL: 104.59
Triglycerides: 133 mg/dL (ref 0.0–149.0)
VLDL: 26.6 mg/dL (ref 0.0–40.0)

## 2019-02-02 LAB — COMPREHENSIVE METABOLIC PANEL
ALBUMIN: 4.1 g/dL (ref 3.5–5.2)
ALK PHOS: 65 U/L (ref 39–117)
ALT: 19 U/L (ref 0–35)
AST: 23 U/L (ref 0–37)
BUN: 19 mg/dL (ref 6–23)
CO2: 29 mEq/L (ref 19–32)
CREATININE: 0.75 mg/dL (ref 0.40–1.20)
Calcium: 9.3 mg/dL (ref 8.4–10.5)
Chloride: 104 mEq/L (ref 96–112)
GFR: 80.69 mL/min (ref >60.00)
Glucose, Bld: 70 mg/dL (ref 70–99)
Potassium: 3.9 mEq/L (ref 3.5–5.1)
Sodium: 139 mEq/L (ref 135–145)
TOTAL PROTEIN: 6.7 g/dL (ref 6.0–8.3)
Total Bilirubin: 0.5 mg/dL (ref 0.2–1.2)

## 2019-02-02 LAB — CBC
HCT: 40.3 % (ref 36.0–46.0)
Hemoglobin: 14 g/dL (ref 12.0–15.0)
MCHC: 34.8 g/dL (ref 30.0–36.0)
MCV: 95 fl (ref 78.0–100.0)
Platelets: 229 10*3/uL (ref 150.0–400.0)
RBC: 4.25 Mil/uL (ref 3.87–5.11)
RDW: 12.1 % (ref 11.5–15.5)
WBC: 5.9 10*3/uL (ref 4.0–10.5)

## 2019-02-02 LAB — T3: T3, Total: 102 ng/dL (ref 76–181)

## 2019-02-02 LAB — VITAMIN D 25 HYDROXY (VIT D DEFICIENCY, FRACTURES): VITD: 33.28 ng/mL (ref 30.00–100.00)

## 2019-02-02 LAB — MAGNESIUM: Magnesium: 2 mg/dL (ref 1.5–2.5)

## 2019-02-02 LAB — TSH: TSH: 1.08 u[IU]/mL (ref 0.35–4.50)

## 2019-02-02 LAB — T4, FREE: Free T4: 0.9 ng/dL (ref 0.60–1.60)

## 2019-02-03 ENCOUNTER — Encounter: Payer: Self-pay | Admitting: Internal Medicine

## 2019-02-03 NOTE — Assessment & Plan Note (Signed)
Continue Omeprazole as needed 

## 2019-02-03 NOTE — Progress Notes (Signed)
Subjective:    Patient ID: Lori Davidson, female    DOB: 09/08/1966, 53 y.o.   MRN: [id number]  HPI  Pt presents to the clinic today for her annual exam.   GERD: Chronic but stable on Omeprazole as needed. Upper GI from 03/2017 reviewed.  Flu: 08/2018 Tetanus: > 10 years ago Pap Smear: 2014, partial hysterectomy Mammogram: 09/2018 Colon Screening: 03/2017 Vision Screening: annually Dentist: biannually  Diet: She does eat meat. She consumes fruits and veggies daily. She tries to avoid fried foods. She drinks mostly water. Exercise: None  Review of Systems  Past Medical History:  Diagnosis Date  . Dysrhythmia    PAC's  . GERD (gastroesophageal reflux disease)   . H/O hiatal hernia   . Hyperthyroidism 2005   resolved after medication for one year  . Thyroid disease    Dx 2005    Current Outpatient Medications  Medication Sig Dispense Refill  . cetirizine (ZYRTEC) 10 MG tablet Take 10 mg by mouth every other day.     . cholecalciferol (VITAMIN D) 1000 units tablet Take 1,000 Units by mouth daily.    . diphenhydrAMINE (BENADRYL) 25 MG tablet Take 25 mg by mouth at bedtime as needed for itching.    . Multiple Vitamin (MULTIVITAMIN WITH MINERALS) TABS tablet Take 1 tablet by mouth daily.    Marland Kitchen omeprazole (PRILOSEC OTC) 20 MG tablet Take 20 mg by mouth daily.    . ranitidine (ZANTAC) 150 MG tablet Take 150 mg by mouth as needed for heartburn.    . sucralfate (CARAFATE) 1 g tablet TAKE 1 TABLET BY MOUTH 4 (FOUR) TIMES DAILY WITH MEALS AND AT BED TIME 60 tablet 2  . triamcinolone cream (KENALOG) 0.1 % Apply 1 application topically 2 (two) times daily as needed (itchy skin).     No current facility-administered medications for this visit.     No Known Allergies  Family History  Problem Relation Age of Onset  . Breast cancer Mother   . Kidney cancer Maternal Grandmother   . Heart disease Maternal Grandfather   . Alzheimer's disease Maternal Grandfather     Social History     Socioeconomic History  . Marital status: Married    Spouse name: Not on file  . Number of children: Not on file  . Years of education: Not on file  . Highest education level: Not on file  Occupational History  . Not on file  Social Needs  . Financial resource strain: Not on file  . Food insecurity:    Worry: Not on file    Inability: Not on file  . Transportation needs:    Medical: Not on file    Non-medical: Not on file  Tobacco Use  . Smoking status: Never Smoker  . Smokeless tobacco: Never Used  Substance and Sexual Activity  . Alcohol use: Yes    Alcohol/week: 4.0 standard drinks    Types: 4 Glasses of wine per week    Comment:    . Drug use: No  . Sexual activity: Not Currently    Birth control/protection: I.U.D.  Lifestyle  . Physical activity:    Days per week: Not on file    Minutes per session: Not on file  . Stress: Not on file  Relationships  . Social connections:    Talks on phone: Not on file    Gets together: Not on file    Attends religious service: Not on file    Active member of club or  organization: Not on file    Attends meetings of clubs or organizations: Not on file    Relationship status: Not on file  . Intimate partner violence:    Fear of current or ex partner: Not on file    Emotionally abused: Not on file    Physically abused: Not on file    Forced sexual activity: Not on file  Other Topics Concern  . Not on file  Social History Narrative  . Not on file     Constitutional: Denies fever, malaise, fatigue, headache or abrupt weight changes.  HEENT: Denies eye pain, eye redness, ear pain, ringing in the ears, wax buildup, runny nose, nasal congestion, bloody nose, or sore throat. Respiratory: Denies difficulty breathing, shortness of breath, cough or sputum production.   Cardiovascular: Denies chest pain, chest tightness, palpitations or swelling in the hands or feet.  Gastrointestinal: Pt reports intermittent reflux. Denies abdominal  pain, bloating, constipation, diarrhea or blood in the stool.  GU: Denies urgency, frequency, pain with urination, burning sensation, blood in urine, odor or discharge. Musculoskeletal: Denies decrease in range of motion, difficulty with gait, muscle pain or joint pain and swelling.  Skin: Denies redness, rashes, lesions or ulcercations.  Neurological: Denies dizziness, difficulty with memory, difficulty with speech or problems with balance and coordination.  Psych: Denies anxiety, depression, SI/HI.  No other specific complaints in a complete review of systems (except as listed in HPI above).     Objective:   Physical Exam   BP 112/74   Pulse 70   Temp 97.9 F (36.6 C) (Oral)   Ht 5' 5.5" (1.664 m)   Wt 175 lb (79.4 kg)   LMP 08/21/2015   SpO2 97%   BMI 28.68 kg/m  Wt Readings from Last 3 Encounters:  02/01/19 175 lb (79.4 kg)  08/25/17 172 lb 12 oz (78.4 kg)  06/02/17 171 lb (77.6 kg)    General: Appears her stated age, well developed, well nourished in NAD. Skin: Warm, dry and intact. HEENT: Head: normal shape and size; Eyes: sclera white, no icterus, conjunctiva pink, PERRLA and EOMs intact; Ears: Tm's gray and intact, normal light reflex; Throat/Mouth: Teeth present, mucosa pink and moist, no exudate, lesions or ulcerations noted.  Neck:  Neck supple, trachea midline. No masses, lumps or thyromegaly present.  Cardiovascular: Normal rate with slightly irregular rhythm. S1,S2 noted.  No murmur, rubs or gallops noted. No JVD or BLE edema. No carotid bruits noted. Pulmonary/Chest: Normal effort and positive vesicular breath sounds. No respiratory distress. No wheezes, rales or ronchi noted.  Abdomen: Soft and nontender. Normal bowel sounds. No distention or masses noted. Liver, spleen and kidneys non palpable. Musculoskeletal: Strength 5/5 bUE/BLE. No difficulty with gait.  Neurological: Alert and oriented. Cranial nerves II-XII grossly intact. Coordination normal.    Psychiatric: Mood and affect normal. Behavior is normal. Judgment and thought content normal.     BMET    Component Value Date/Time   NA 139 02/01/2019 1509   K 3.9 02/01/2019 1509   CL 104 02/01/2019 1509   CO2 29 02/01/2019 1509   GLUCOSE 70 02/01/2019 1509   BUN 19 02/01/2019 1509   CREATININE 0.75 02/01/2019 1509   CALCIUM 9.3 02/01/2019 1509   GFRNONAA >60 03/09/2016 1602   GFRAA >60 03/09/2016 1602    Lipid Panel     Component Value Date/Time   CHOL 161 02/01/2019 1509   TRIG 133.0 02/01/2019 1509   HDL 56.80 02/01/2019 1509   CHOLHDL 3 02/01/2019  1509   VLDL 26.6 02/01/2019 1509   LDLCALC 78 02/01/2019 1509    CBC    Component Value Date/Time   WBC 5.9 02/01/2019 1509   RBC 4.25 02/01/2019 1509   HGB 14.0 02/01/2019 1509   HCT 40.3 02/01/2019 1509   PLT 229.0 02/01/2019 1509   MCV 95.0 02/01/2019 1509   MCH 32.2 03/09/2016 1602   MCHC 34.8 02/01/2019 1509   RDW 12.1 02/01/2019 1509    Hgb A1C No results found for: HGBA1C         Assessment & Plan:   Preventative Health Maintenance:  Flu shot UTD Tdap today Pelvic exam UTD Mammogram UTD Colon Screening UTD Encouraged her to consume a balanced diet and exercise regimen Advised her to see an eye doctor and dentist annually Will check CBC, CMET, Lipid, TSH, Free T4, T3 and Vit D today  Irregular Heart Rhythm:  Indication for ECG: abnormal rhythm Interpretation: PAC's, no acute findings Comparison: 08/2015, unchanged Will check TSH , Free T4, T3, CMET and Mg  RTC in 1 year, sooner if needed Webb Silversmith, NP

## 2019-07-01 ENCOUNTER — Encounter: Payer: Self-pay | Admitting: Internal Medicine

## 2019-07-01 MED ORDER — SUCRALFATE 1 G PO TABS: 1.0000 g | ORAL_TABLET | Freq: Four times a day (QID) | ORAL | 0 refills | Status: AC

## 2019-09-06 ENCOUNTER — Ambulatory Visit (INDEPENDENT_AMBULATORY_CARE_PROVIDER_SITE_OTHER): Payer: 59 | Admitting: Internal Medicine

## 2019-09-06 ENCOUNTER — Other Ambulatory Visit: Payer: Self-pay

## 2019-09-06 ENCOUNTER — Encounter: Payer: Self-pay | Admitting: Internal Medicine

## 2019-09-06 VITALS — BP 112/68 | HR 68 | Temp 97.8°F | Wt 165.0 lb

## 2019-09-06 DIAGNOSIS — L255 Unspecified contact dermatitis due to plants, except food: Secondary | ICD-10-CM

## 2019-09-06 MED ORDER — CLOBETASOL PROPIONATE 0.05 % EX CREA: 1.0000 "application " | TOPICAL_CREAM | Freq: Two times a day (BID) | CUTANEOUS | 0 refills | Status: DC

## 2019-09-06 MED ORDER — METHYLPREDNISOLONE ACETATE 80 MG/ML IJ SUSP
80.0000 mg | Freq: Once | INTRAMUSCULAR | Status: AC
  Administered 2019-09-06: 80 mg via INTRAMUSCULAR

## 2019-09-06 NOTE — Progress Notes (Signed)
Subjective:    Patient ID: Lori Davidson, female    DOB: 1966-08-26, 53 y.o.   MRN: [id number]  HPI  Pt presents to the clinic today with c/o rash. She noticed this 1 week ago. The rash has since spread to her trunk and arms. The rash itches but does not burn. She has not noticed any drainage from the rash. She denies changes in soaps, lotions or detergents but has been working out in the yard, cleaning the yard, trimming bushes. She has tried Benadryl, Triamcinolone Cream with minimal relief.  Review of Systems      Past Medical History:  Diagnosis Date  . Dysrhythmia    PAC's  . GERD (gastroesophageal reflux disease)   . H/O hiatal hernia   . Hyperthyroidism 2005   resolved after medication for one year  . Thyroid disease    Dx 2005    Current Outpatient Medications  Medication Sig Dispense Refill  . ADVANCED EVENING PRIMROSE OIL PO Take by mouth 3 (three) times daily.    . cetirizine (ZYRTEC) 10 MG tablet Take 10 mg by mouth every other day.     . cholecalciferol (VITAMIN D) 1000 units tablet Take 1,000 Units by mouth daily.    . diphenhydrAMINE (BENADRYL) 25 MG tablet Take 25 mg by mouth at bedtime as needed for itching.    . Multiple Vitamin (MULTIVITAMIN WITH MINERALS) TABS tablet Take 1 tablet by mouth daily.    Marland Kitchen omeprazole (PRILOSEC OTC) 20 MG tablet Take 20 mg by mouth 2 (two) times daily.     Marland Kitchen OVER THE COUNTER MEDICATION Magnesium Lotion    . sucralfate (CARAFATE) 1 g tablet Take 1 tablet (1 g total) by mouth 4 (four) times daily. 120 tablet 0  . triamcinolone cream (KENALOG) 0.1 % Apply 1 application topically 2 (two) times daily as needed (itchy skin).    Marland Kitchen zinc gluconate 50 MG tablet Take 50 mg by mouth daily.     No current facility-administered medications for this visit.     No Known Allergies  Family History  Problem Relation Age of Onset  . Breast cancer Mother   . Kidney cancer Maternal Grandmother   . Heart disease Maternal Grandfather   .  Alzheimer's disease Maternal Grandfather     Social History   Socioeconomic History  . Marital status: Married    Spouse name: Not on file  . Number of children: Not on file  . Years of education: Not on file  . Highest education level: Not on file  Occupational History  . Not on file  Social Needs  . Financial resource strain: Not on file  . Food insecurity    Worry: Not on file    Inability: Not on file  . Transportation needs    Medical: Not on file    Non-medical: Not on file  Tobacco Use  . Smoking status: Never Smoker  . Smokeless tobacco: Never Used  Substance and Sexual Activity  . Alcohol use: Yes    Alcohol/week: 4.0 standard drinks    Types: 4 Glasses of wine per week    Comment:    . Drug use: No  . Sexual activity: Not Currently    Birth control/protection: I.U.D.  Lifestyle  . Physical activity    Days per week: Not on file    Minutes per session: Not on file  . Stress: Not on file  Relationships  . Social connections    Talks on phone: Not on  file    Gets together: Not on file    Attends religious service: Not on file    Active member of club or organization: Not on file    Attends meetings of clubs or organizations: Not on file    Relationship status: Not on file  . Intimate partner violence    Fear of current or ex partner: Not on file    Emotionally abused: Not on file    Physically abused: Not on file    Forced sexual activity: Not on file  Other Topics Concern  . Not on file  Social History Narrative  . Not on file     Constitutional: Denies fever, malaise, fatigue, headache or abrupt weight changes.  Respiratory: Denies difficulty breathing, shortness of breath, cough or sputum production.   Cardiovascular: Denies chest pain, chest tightness, palpitations or swelling in the hands or feet.  Skin: Pt reports rash. Denies ulcercations.    No other specific complaints in a complete review of systems (except as listed in HPI above).   Objective:   Physical Exam  BP 112/68   Pulse 68   Temp 97.8 F (36.6 C) (Temporal)   Wt 165 lb (74.8 kg)   LMP 08/21/2015   SpO2 98%   BMI 27.04 kg/m  Wt Readings from Last 3 Encounters:  09/06/19 165 lb (74.8 kg)  02/01/19 175 lb (79.4 kg)  08/25/17 172 lb 12 oz (78.4 kg)    General: Appears her stated age, well developed, well nourished in NAD. Skin: Vesicular lesion on erythematous base noted of right lateral lower leg. Multiple scattered maculopapular lesions noted of bilateral abdomen and forearms.  Neurological: Alert and oriented.    BMET    Component Value Date/Time   NA 139 02/01/2019 1509   K 3.9 02/01/2019 1509   CL 104 02/01/2019 1509   CO2 29 02/01/2019 1509   GLUCOSE 70 02/01/2019 1509   BUN 19 02/01/2019 1509   CREATININE 0.75 02/01/2019 1509   CALCIUM 9.3 02/01/2019 1509   GFRNONAA >60 03/09/2016 1602   GFRAA >60 03/09/2016 1602    Lipid Panel     Component Value Date/Time   CHOL 161 02/01/2019 1509   TRIG 133.0 02/01/2019 1509   HDL 56.80 02/01/2019 1509   CHOLHDL 3 02/01/2019 1509   VLDL 26.6 02/01/2019 1509   LDLCALC 78 02/01/2019 1509    CBC    Component Value Date/Time   WBC 5.9 02/01/2019 1509   RBC 4.25 02/01/2019 1509   HGB 14.0 02/01/2019 1509   HCT 40.3 02/01/2019 1509   PLT 229.0 02/01/2019 1509   MCV 95.0 02/01/2019 1509   MCH 32.2 03/09/2016 1602   MCHC 34.8 02/01/2019 1509   RDW 12.1 02/01/2019 1509    Hgb A1C No results found for: HGBA1C          Assessment & Plan:   Contact Dermatitis due to Plant:  80 mg of Depo Medrol IM today RX for Clobetasol 0.5% cream BID- wash hands after application Ok to continue Benadryl or use other antihistamine OTC  Return precautions discussed Webb Silversmith, NP

## 2019-09-06 NOTE — Addendum Note (Signed)
Addended by: Lurlean Nanny on: 09/06/2019 12:05 PM   Modules accepted: Orders

## 2019-09-06 NOTE — Patient Instructions (Signed)
Contact Dermatitis Dermatitis is redness, soreness, and swelling (inflammation) of the skin. Contact dermatitis is a reaction to something that touches the skin. There are two types of contact dermatitis:  Irritant contact dermatitis. This happens when something bothers (irritates) your skin, like soap.  Allergic contact dermatitis. This is caused when you are exposed to something that you are allergic to, such as poison ivy. What are the causes?  Common causes of irritant contact dermatitis include: ? Makeup. ? Soaps. ? Detergents. ? Bleaches. ? Acids. ? Metals, such as nickel.  Common causes of allergic contact dermatitis include: ? Plants. ? Chemicals. ? Jewelry. ? Latex. ? Medicines. ? Preservatives in products, such as clothing. What increases the risk?  Having a job that exposes you to things that bother your skin.  Having asthma or eczema. What are the signs or symptoms? Symptoms may happen anywhere the irritant has touched your skin. Symptoms include:  Dry or flaky skin.  Redness.  Cracks.  Itching.  Pain or a burning feeling.  Blisters.  Blood or clear fluid draining from skin cracks. With allergic contact dermatitis, swelling may occur. This may happen in places such as the eyelids, mouth, or genitals. How is this treated?  This condition is treated by checking for the cause of the reaction and protecting your skin. Treatment may also include: ? Steroid creams, ointments, or medicines. ? Antibiotic medicines or other ointments, if you have a skin infection. ? Lotion or medicines to help with itching. ? A bandage (dressing). Follow these instructions at home: Skin care  Moisturize your skin as needed.  Put cool cloths on your skin.  Put a baking soda paste on your skin. Stir water into baking soda until it looks like a paste.  Do not scratch your skin.  Avoid having things rub up against your skin.  Avoid the use of soaps, perfumes, and dyes.  Medicines  Take or apply over-the-counter and prescription medicines only as told by your doctor.  If you were prescribed an antibiotic medicine, take or apply it as told by your doctor. Do not stop using it even if your condition starts to get better. Bathing  Take a bath with: ? Epsom salts. ? Baking soda. ? Colloidal oatmeal.  Bathe less often.  Bathe in warm water. Avoid using hot water. Bandage care  If you were given a bandage, change it as told by your health care provider.  Wash your hands with soap and water before and after you change your bandage. If soap and water are not available, use hand sanitizer. General instructions  Avoid the things that caused your reaction. If you do not know what caused it, keep a journal. Write down: ? What you eat. ? What skin products you use. ? What you drink. ? What you wear in the area that has symptoms. This includes jewelry.  Check the affected areas every day for signs of infection. Check for: ? More redness, swelling, or pain. ? More fluid or blood. ? Warmth. ? Pus or a bad smell.  Keep all follow-up visits as told by your doctor. This is important. Contact a doctor if:  You do not get better with treatment.  Your condition gets worse.  You have signs of infection, such as: ? More swelling. ? Tenderness. ? More redness. ? Soreness. ? Warmth.  You have a fever.  You have new symptoms. Get help right away if:  You have a very bad headache.  You have neck pain.    Your neck is stiff.  You throw up (vomit).  You feel very sleepy.  You see red streaks coming from the area.  Your bone or joint near the area hurts after the skin has healed.  The area turns darker.  You have trouble breathing. Summary  Dermatitis is redness, soreness, and swelling of the skin.  Symptoms may occur where the irritant has touched you.  Treatment may include medicines and skin care.  If you do not know what caused your  reaction, keep a journal.  Contact a doctor if your condition gets worse or you have signs of infection. This information is not intended to replace advice given to you by your health care provider. Make sure you discuss any questions you have with your health care provider. Document Released: 09/28/2009 Document Revised: 03/23/2019 Document Reviewed: 06/16/2018 Elsevier Patient Education  2020 Elsevier Inc.  

## 2019-09-09 ENCOUNTER — Encounter: Payer: Self-pay | Admitting: Internal Medicine

## 2019-09-09 MED ORDER — PREDNISONE 10 MG PO TABS: ORAL_TABLET | ORAL | 0 refills | Status: DC

## 2019-09-15 MED ORDER — PREDNISONE 20 MG PO TABS: ORAL_TABLET | ORAL | 0 refills | Status: DC

## 2019-09-15 NOTE — Addendum Note (Signed)
Addended by: Jearld Fenton on: 09/15/2019 12:02 PM   Modules accepted: Orders

## 2019-10-07 ENCOUNTER — Other Ambulatory Visit: Payer: Self-pay | Admitting: Obstetrics & Gynecology

## 2019-10-07 DIAGNOSIS — Z1231 Encounter for screening mammogram for malignant neoplasm of breast: Secondary | ICD-10-CM

## 2019-11-28 ENCOUNTER — Ambulatory Visit
Admission: RE | Admit: 2019-11-28 | Discharge: 2019-11-28 | Disposition: A | Discharge status: Home / Self Care | Payer: 59 | Source: Ambulatory Visit | Attending: Obstetrics & Gynecology | Admitting: Obstetrics & Gynecology

## 2019-11-28 ENCOUNTER — Other Ambulatory Visit: Payer: Self-pay

## 2019-11-28 DIAGNOSIS — Z1231 Encounter for screening mammogram for malignant neoplasm of breast: Secondary | ICD-10-CM

## 2020-02-13 DIAGNOSIS — L814 Other melanin hyperpigmentation: Secondary | ICD-10-CM | POA: Diagnosis not present

## 2020-02-13 DIAGNOSIS — L820 Inflamed seborrheic keratosis: Secondary | ICD-10-CM | POA: Diagnosis not present

## 2020-02-13 DIAGNOSIS — L815 Leukoderma, not elsewhere classified: Secondary | ICD-10-CM | POA: Diagnosis not present

## 2020-02-13 DIAGNOSIS — D1801 Hemangioma of skin and subcutaneous tissue: Secondary | ICD-10-CM | POA: Diagnosis not present

## 2020-02-13 DIAGNOSIS — L239 Allergic contact dermatitis, unspecified cause: Secondary | ICD-10-CM | POA: Diagnosis not present

## 2020-04-23 ENCOUNTER — Encounter: Payer: Self-pay | Admitting: Internal Medicine

## 2020-04-30 DIAGNOSIS — S50861A Insect bite (nonvenomous) of right forearm, initial encounter: Secondary | ICD-10-CM | POA: Diagnosis not present

## 2020-04-30 DIAGNOSIS — L239 Allergic contact dermatitis, unspecified cause: Secondary | ICD-10-CM | POA: Diagnosis not present

## 2020-09-11 DIAGNOSIS — Z01411 Encounter for gynecological examination (general) (routine) with abnormal findings: Secondary | ICD-10-CM | POA: Diagnosis not present

## 2020-09-11 DIAGNOSIS — N951 Menopausal and female climacteric states: Secondary | ICD-10-CM | POA: Diagnosis not present

## 2020-09-11 DIAGNOSIS — Z79890 Hormone replacement therapy (postmenopausal): Secondary | ICD-10-CM | POA: Diagnosis not present

## 2021-01-21 ENCOUNTER — Other Ambulatory Visit: Payer: Self-pay | Admitting: Internal Medicine

## 2021-01-21 DIAGNOSIS — Z1231 Encounter for screening mammogram for malignant neoplasm of breast: Secondary | ICD-10-CM

## 2021-02-19 ENCOUNTER — Other Ambulatory Visit: Payer: Self-pay

## 2021-02-19 ENCOUNTER — Ambulatory Visit
Admission: RE | Admit: 2021-02-19 | Discharge: 2021-02-19 | Disposition: A | Discharge status: Home / Self Care | Payer: 59 | Source: Ambulatory Visit

## 2021-02-19 DIAGNOSIS — Z1231 Encounter for screening mammogram for malignant neoplasm of breast: Secondary | ICD-10-CM

## 2021-02-25 DIAGNOSIS — L814 Other melanin hyperpigmentation: Secondary | ICD-10-CM | POA: Diagnosis not present

## 2021-02-25 DIAGNOSIS — L821 Other seborrheic keratosis: Secondary | ICD-10-CM | POA: Diagnosis not present

## 2021-03-27 ENCOUNTER — Encounter: Payer: Self-pay | Admitting: Family Medicine

## 2021-03-27 ENCOUNTER — Telehealth (INDEPENDENT_AMBULATORY_CARE_PROVIDER_SITE_OTHER): Payer: 59 | Admitting: Family Medicine

## 2021-03-27 VITALS — HR 77 | Ht 65.5 in | Wt 165.0 lb

## 2021-03-27 DIAGNOSIS — J208 Acute bronchitis due to other specified organisms: Secondary | ICD-10-CM | POA: Diagnosis not present

## 2021-03-27 MED ORDER — DOXYCYCLINE HYCLATE 100 MG PO TABS: 100.0000 mg | ORAL_TABLET | Freq: Two times a day (BID) | ORAL | 0 refills | Status: AC

## 2021-03-27 NOTE — Progress Notes (Signed)
      Lori Dehaan T. Hajra Port, MD [facility] [place] [facility] [place], [address] Phone: [phone]  FAX: [phone]  Lori Davidson - 55 y.o. female  MRN [id number]  Date of Birth: 01/30/1966  Visit Date: 03/27/2021  PCP: Lori Fenton, NP  Referred by: Lori Fenton, NP  Virtual Visit via Video Note:  I connected with  Lori Davidson on 03/27/2021 11:00 AM EDT by a video enabled telemedicine application and verified that I am speaking with the correct person using two identifiers.   Location patient: home computer, tablet, or smartphone Location provider: work or home office Consent: Verbal consent directly obtained from [employer]. Persons participating in the virtual visit: patient, provider  I discussed the limitations of evaluation and management by telemedicine and the availability of in person appointments. The patient expressed understanding and agreed to proceed.  Chief Complaint  Patient presents with  . Cough    With upper chest congestion   . Nasal Congestion  . Sinus Drainage  . Sore Throat    History of Present Illness:  About 1 week ago or so. Coughing up a lot of mucous at night.  Sinuses are having pain.  She started off more with some sore throat, this is progressed to primary pulmonary congestion.  She also has some nasal congestion, she is started to develop some pain in her sinuses and in her teeth.  She is not a smoker, and she has never been a smoker. She does not think that she has had a fever at all.  She has tried a number of over-the-counter remedies including Mucinex, tea, water, and other things which have not really helped at all.  Review of Systems as above: See pertinent positives and pertinent negatives per HPI No acute distress verbally   Observations/Objective/Exam:  An attempt was made to discern vital signs over the phone and per patient if applicable and  possible.   General:    Alert, Oriented, appears well and in no acute distress  Pulmonary:     On inspection no signs of respiratory distress.  Psych / Neurological:     Pleasant and cooperative.  Assessment and Plan:    ICD-10-CM   1. Acute bronchitis due to other specified organisms  J20.8    Continue with basic over-the-counter cough and cold remedies, Aleve if needed for pain, Chloraseptic for sore throat.  Plenty of liquids, sleep, and basic p.o. intake.  Given the timeframe, I think is also probably reasonable to have her covered for potential atypicals.  I discussed the assessment and treatment plan with the patient. The patient was provided an opportunity to ask questions and all were answered. The patient agreed with the plan and demonstrated an understanding of the instructions.   The patient was advised to call back or seek an in-person evaluation if the symptoms worsen or if the condition fails to improve as anticipated.  Follow-up: prn unless noted otherwise below No follow-ups on file.  Meds ordered this encounter  Medications  . doxycycline (VIBRA-TABS) 100 MG tablet    Sig: Take 1 tablet (100 mg total) by mouth 2 (two) times daily for 10 days.    Dispense:  20 tablet    Refill:  0   No orders of the defined types were placed in this encounter.   Signed,  Maud Deed. Nami Strawder, MD

## 2022-01-07 ENCOUNTER — Encounter: Payer: Self-pay | Admitting: Internal Medicine

## 2022-01-08 ENCOUNTER — Telehealth: Payer: 59 | Admitting: Family

## 2022-01-08 DIAGNOSIS — J019 Acute sinusitis, unspecified: Secondary | ICD-10-CM | POA: Diagnosis not present

## 2022-01-08 MED ORDER — AMOXICILLIN-POT CLAVULANATE 875-125 MG PO TABS: 1.0000 | ORAL_TABLET | Freq: Two times a day (BID) | ORAL | 0 refills | Status: DC

## 2022-01-08 NOTE — Progress Notes (Signed)

## 2022-01-14 ENCOUNTER — Ambulatory Visit (INDEPENDENT_AMBULATORY_CARE_PROVIDER_SITE_OTHER): Payer: 59 | Admitting: Internal Medicine

## 2022-01-14 ENCOUNTER — Encounter: Payer: Self-pay | Admitting: Internal Medicine

## 2022-01-14 ENCOUNTER — Other Ambulatory Visit: Payer: Self-pay

## 2022-01-14 ENCOUNTER — Telehealth: Payer: 59 | Admitting: Physician Assistant

## 2022-01-14 VITALS — BP 121/74 | HR 70 | Temp 97.1°F | Wt 171.0 lb

## 2022-01-14 DIAGNOSIS — J0100 Acute maxillary sinusitis, unspecified: Secondary | ICD-10-CM

## 2022-01-14 DIAGNOSIS — U071 COVID-19: Secondary | ICD-10-CM

## 2022-01-14 DIAGNOSIS — J329 Chronic sinusitis, unspecified: Secondary | ICD-10-CM

## 2022-01-14 MED ORDER — PREDNISONE 10 MG PO TABS: ORAL_TABLET | ORAL | 0 refills | Status: AC

## 2022-01-14 MED ORDER — AZITHROMYCIN 250 MG PO TABS: ORAL_TABLET | ORAL | 0 refills | Status: DC

## 2022-01-14 NOTE — Progress Notes (Signed)
Based on what you shared with me, I feel your condition warrants further evaluation and I recommend that you be seen in a face to face visit.  Giving ongoing and progressing symptoms despite treatment, you will need to be evaluated either with your PCP/office Webb Silversmith), or at a local Urgent Care. It is our policy via virtual care that if you are not responding to treatment given, you must be seen in person.    NOTE: There will be NO CHARGE for this eVisit   If you are having a true medical emergency please call 911.      For an urgent face to face visit, [facility] has six urgent care centers for your convenience:     [facility] [place] [facility] [place] Get Driving Directions [phone] [place], [place] [address]    [facility]) Get Driving Directions [phone] [place], [place] [address]  [facility] [place] [facility]) Get Driving Directions [phone] [address] [place],  [place]  [address]  [facility] Get Driving Directions [phone] [place], [place], [place] [address]   [facility] Get Driving Directions  [phone] [address], [place] [address]   [facility] [place] Get Driving Directions [phone] [address], [place], [place] [address]  Your MyChart E-visit questionnaire answers were reviewed by a board certified advanced clinical practitioner to complete your personal care plan based on your specific symptoms.  Thank you for using e-Visits.

## 2022-01-14 NOTE — Patient Instructions (Signed)

## 2022-01-14 NOTE — Progress Notes (Signed)
HPI  Patient presents the clinic today with complaint of headache, facial pain and pressure, ear pain, runny nose, nasal congestion and cough.  She reports this started 1 week ago. The headache is located in her forehead. She denies dizziness or vision changes. She is blowing yellow mucus out of her nose. The cough is non productive. She denies fever, chills or body aches. She did an E-visit 01/08/2022 for the same.  She was diagnosed with sinusitis and prescribed Augmentin, which she has not yet completed.  She has had COVID exposure but had a negative COVID test yesterday but she was positive on 01/01/2022. She does not smoke.She did an E-visit earlier today and they advised her to schedule an in person visit for face-to-face evaluation.    Review of Systems     Past Medical History:  Diagnosis Date   Dysrhythmia    PAC's   GERD (gastroesophageal reflux disease)    H/O hiatal hernia    Hyperthyroidism 2005   resolved after medication for one year   Thyroid disease    Dx 2005    Family History  Problem Relation Age of Onset   Breast cancer Mother    Kidney cancer Maternal Grandmother    Heart disease Maternal Grandfather    Alzheimer's disease Maternal Grandfather     Social History   Socioeconomic History   Marital status: Divorced    Spouse name: Not on file   Number of children: Not on file   Years of education: Not on file   Highest education level: Not on file  Occupational History   Not on file  Tobacco Use   Smoking status: Never   Smokeless tobacco: Never  Substance and Sexual Activity   Alcohol use: Yes    Alcohol/week: 4.0 standard drinks    Types: 4 Glasses of wine per week    Comment:     Drug use: No   Sexual activity: Not Currently    Birth control/protection: I.U.D.  Other Topics Concern   Not on file  Social History Narrative   Not on file   Social Determinants of Health   Financial Resource Strain: Not on file  Food Insecurity: Not on file   Transportation Needs: Not on file  Physical Activity: Not on file  Stress: Not on file  Social Connections: Not on file  Intimate Partner Violence: Not on file    No Known Allergies   Constitutional: Positive headache, fatigue . Denies abrupt weight changes.  HEENT:  Positive facial pain, runny nose, nasal congestion. Denies eye redness, ear pain, ringing in the ears, wax buildup, or sore throat. Respiratory: Positive cough. Denies difficulty breathing or shortness of breath.  Cardiovascular: Denies chest pain, chest tightness, palpitations or swelling in the hands or feet.   No other specific complaints in a complete review of systems (except as listed in HPI above).  Objective:   BP 121/74 (BP Location: Left Arm, Patient Position: Sitting, Cuff Size: Normal)    Pulse 70    Temp (!) 97.1 F (36.2 C) (Temporal)    Wt 171 lb (77.6 kg)    LMP 08/21/2015    SpO2 98%    BMI 28.02 kg/m    General: Appears her stated age, well developed, well nourished in NAD. HEENT: Head: normal shape and size, maxillary sinus tenderness noted; Eyes: sclera white, no icterus, conjunctiva pink;  Throat/Mouth: + PND. Teeth present, mucosa erythematous and moist, no exudate noted, no lesions or ulcerations noted.  Neck:  No adenopathy noted.  Cardiovascular: Normal rate and rhythm. S1,S2 noted.  No murmur, rubs or gallops noted.  Pulmonary/Chest: Normal effort and positive vesicular breath sounds. No respiratory distress. No wheezes, rales or ronchi noted.       Assessment & Plan:   Acute Maxillary Sinusitis secondary to Covid 19.  Can use a Neti Pot which can be purchased from your local drug store. Flonase 2 sprays each nostril for 3 days and then as needed. Rx for Azithromycin 250 mg daily x 5 days RX for Pred Taper x 6 days Consider follow-up with ENT if symptoms persist or worsen  RTC as needed or if symptoms persist. Webb Silversmith, NP This visit occurred during the SARS-CoV-2 public health  emergency.  Safety protocols were in place, including screening questions prior to the visit, additional usage of staff PPE, and extensive cleaning of exam room while observing appropriate contact time as indicated for disinfecting solutions.

## 2022-02-24 ENCOUNTER — Other Ambulatory Visit: Payer: Self-pay | Admitting: Internal Medicine

## 2022-02-24 DIAGNOSIS — Z1231 Encounter for screening mammogram for malignant neoplasm of breast: Secondary | ICD-10-CM

## 2022-03-11 ENCOUNTER — Ambulatory Visit
Admission: RE | Admit: 2022-03-11 | Discharge: 2022-03-11 | Disposition: A | Discharge status: Home / Self Care | Payer: 59 | Source: Ambulatory Visit | Attending: Internal Medicine | Admitting: Internal Medicine

## 2022-03-11 DIAGNOSIS — Z1231 Encounter for screening mammogram for malignant neoplasm of breast: Secondary | ICD-10-CM

## 2022-04-14 DIAGNOSIS — L249 Irritant contact dermatitis, unspecified cause: Secondary | ICD-10-CM | POA: Diagnosis not present

## 2022-04-14 DIAGNOSIS — D1801 Hemangioma of skin and subcutaneous tissue: Secondary | ICD-10-CM | POA: Diagnosis not present

## 2022-04-14 DIAGNOSIS — L814 Other melanin hyperpigmentation: Secondary | ICD-10-CM | POA: Diagnosis not present

## 2022-09-08 DIAGNOSIS — M79672 Pain in left foot: Secondary | ICD-10-CM | POA: Diagnosis not present

## 2023-03-04 ENCOUNTER — Telehealth: Payer: 59 | Admitting: Physician Assistant

## 2023-03-04 ENCOUNTER — Telehealth: Payer: 59

## 2023-03-04 DIAGNOSIS — R3989 Other symptoms and signs involving the genitourinary system: Secondary | ICD-10-CM | POA: Diagnosis not present

## 2023-03-04 MED ORDER — CEPHALEXIN 500 MG PO CAPS: 500.0000 mg | ORAL_CAPSULE | Freq: Two times a day (BID) | ORAL | 0 refills | Status: AC

## 2023-03-04 NOTE — Patient Instructions (Signed)
Lori Davidson, thank you for joining Mar Daring, PA-C for today's virtual visit.  While this provider is not your primary care provider (PCP), if your PCP is located in our provider database this encounter information will be shared with them immediately following your visit.   [facility] account gives you access to today's visit and all your visits, tests, and labs performed at [facility] " click here if you don't have a [facility] account or go to [email or web address]  Consent: (Patient) Lori Davidson provided verbal consent for this virtual visit at the beginning of the encounter.  Current Medications:  Current Outpatient Medications:    cephALEXin (KEFLEX) 500 MG capsule, Take 1 capsule (500 mg total) by mouth 2 (two) times daily., Disp: 14 capsule, Rfl: 0   cetirizine (ZYRTEC) 10 MG tablet, Take 10 mg by mouth every other day. , Disp: , Rfl:    cholecalciferol (VITAMIN D) 1000 units tablet, Take 1,000 Units by mouth daily., Disp: , Rfl:    Multiple Vitamin (MULTIVITAMIN WITH MINERALS) TABS tablet, Take 1 tablet by mouth daily., Disp: , Rfl:    omeprazole (PRILOSEC OTC) 20 MG tablet, Take 20 mg by mouth 2 (two) times daily. , Disp: , Rfl:    predniSONE (DELTASONE) 10 MG tablet, Take 6 tabs on day 1, 5 tabs on day 2, 4 tabs on day 3, 3 tabs on day 4, 2 tabs on day 5, 1 tab on day 6, Disp: 21 tablet, Rfl: 0   sucralfate (CARAFATE) 1 g tablet, Take 1 tablet (1 g total) by mouth 4 (four) times daily., Disp: 120 tablet, Rfl: 0   triamcinolone cream (KENALOG) 0.1 %, Apply 1 application topically 2 (two) times daily as needed (itchy skin)., Disp: , Rfl:    zinc gluconate 50 MG tablet, Take 50 mg by mouth daily., Disp: , Rfl:    Medications ordered in this encounter:  Meds ordered this encounter  Medications   cephALEXin (KEFLEX) 500 MG capsule    Sig: Take 1 capsule (500 mg total) by mouth 2 (two) times daily.    Dispense:  14 capsule     Refill:  0    Order Specific Question:   Supervising Provider    Answer:   Chase Picket [id number]     *If you need refills on other medications prior to your next appointment, please contact your pharmacy*  Follow-Up: Call back or seek an in-person evaluation if the symptoms worsen or if the condition fails to improve as anticipated.  [facility] [phone]  Other Instructions  Urinary Tract Infection, Adult  A urinary tract infection (UTI) is an infection of any part of the urinary tract. The urinary tract includes the kidneys, ureters, bladder, and urethra. These organs make, store, and get rid of urine in the body. An upper UTI affects the ureters and kidneys. A lower UTI affects the bladder and urethra. What are the causes? Most urinary tract infections are caused by bacteria in your genital area around your urethra, where urine leaves your body. These bacteria grow and cause inflammation of your urinary tract. What increases the risk? You are more likely to develop this condition if: You have a urinary catheter that stays in place. You are not able to control when you urinate or have a bowel movement (incontinence). You are female and you: Use a spermicide or diaphragm for birth control. Have low estrogen levels. Are pregnant. You have certain genes  that increase your risk. You are sexually active. You take antibiotic medicines. You have a condition that causes your flow of urine to slow down, such as: An enlarged prostate, if you are female. Blockage in your urethra. A kidney stone. A nerve condition that affects your bladder control (neurogenic bladder). Not getting enough to drink, or not urinating often. You have certain medical conditions, such as: Diabetes. A weak disease-fighting system (immunesystem). Sickle cell disease. Gout. Spinal cord injury. What are the signs or symptoms? Symptoms of this condition include: Needing to urinate right  away (urgency). Frequent urination. This may include small amounts of urine each time you urinate. Pain or burning with urination. Blood in the urine. Urine that smells bad or unusual. Trouble urinating. Cloudy urine. Vaginal discharge, if you are female. Pain in the abdomen or the lower back. You may also have: Vomiting or a decreased appetite. Confusion. Irritability or tiredness. A fever or chills. Diarrhea. The first symptom in older adults may be confusion. In some cases, they may not have any symptoms until the infection has worsened. How is this diagnosed? This condition is diagnosed based on your medical history and a physical exam. You may also have other tests, including: Urine tests. Blood tests. Tests for STIs (sexually transmitted infections). If you have had more than one UTI, a cystoscopy or imaging studies may be done to determine the cause of the infections. How is this treated? Treatment for this condition includes: Antibiotic medicine. Over-the-counter medicines to treat discomfort. Drinking enough water to stay hydrated. If you have frequent infections or have other conditions such as a kidney stone, you may need to see a health care provider who specializes in the urinary tract (urologist). In rare cases, urinary tract infections can cause sepsis. Sepsis is a life-threatening condition that occurs when the body responds to an infection. Sepsis is treated in the hospital with IV antibiotics, fluids, and other medicines. Follow these instructions at home:  Medicines Take over-the-counter and prescription medicines only as told by your health care provider. If you were prescribed an antibiotic medicine, take it as told by your health care provider. Do not stop using the antibiotic even if you start to feel better. General instructions Make sure you: Empty your bladder often and completely. Do not hold urine for long periods of time. Empty your bladder after  sex. Wipe from front to back after urinating or having a bowel movement if you are female. Use each tissue only one time when you wipe. Drink enough fluid to keep your urine pale yellow. Keep all follow-up visits. This is important. Contact a health care provider if: Your symptoms do not get better after 1-2 days. Your symptoms go away and then return. Get help right away if: You have severe pain in your back or your lower abdomen. You have a fever or chills. You have nausea or vomiting. Summary A urinary tract infection (UTI) is an infection of any part of the urinary tract, which includes the kidneys, ureters, bladder, and urethra. Most urinary tract infections are caused by bacteria in your genital area. Treatment for this condition often includes antibiotic medicines. If you were prescribed an antibiotic medicine, take it as told by your health care provider. Do not stop using the antibiotic even if you start to feel better. Keep all follow-up visits. This is important. This information is not intended to replace advice given to you by your health care provider. Make sure you discuss any questions you have with  your health care provider. Document Revised: 07/13/2020 Document Reviewed: 07/13/2020 Elsevier Patient Education  [place].    If you have been instructed to have an in-person evaluation today at a local Urgent Care facility, please use the link below. It will take you to a list of all of our available [facility], including address, phone number and hours of operation. Please do not delay care.  [facility]  If you or a family member do not have a primary care provider, use the link below to schedule a visit and establish care. When you choose a [facility] primary care physician or advanced practice provider, you gain a long-term partner in health. Find a Primary Care Provider  Learn more about [facility] in-office and virtual care  options: [facility]

## 2023-03-04 NOTE — Progress Notes (Signed)
Virtual Visit Consent   Lori Davidson, you are scheduled for a virtual visit with a [facility] provider today. Just as with appointments in the office, your consent must be obtained to participate. Your consent will be active for this visit and any virtual visit you may have with one of our providers in the next 365 days. If you have a MyChart account, a copy of this consent can be sent to you electronically.  As this is a virtual visit, video technology does not allow for your provider to perform a traditional examination. This may limit your provider's ability to fully assess your condition. If your provider identifies any concerns that need to be evaluated in person or the need to arrange testing (such as labs, EKG, etc.), we will make arrangements to do so. Although advances in technology are sophisticated, we cannot ensure that it will always work on either your end or our end. If the connection with a video visit is poor, the visit may have to be switched to a telephone visit. With either a video or telephone visit, we are not always able to ensure that we have a secure connection.  By engaging in this virtual visit, you consent to the provision of healthcare and authorize for your insurance to be billed (if applicable) for the services provided during this visit. Depending on your insurance coverage, you may receive a charge related to this service.  I need to obtain your verbal consent now. Are you willing to proceed with your visit today? Lori Davidson has provided verbal consent on 03/04/2023 for a virtual visit (video or telephone). Mar Daring, PA-C  Date: 03/04/2023 8:55 AM  Virtual Visit via Video Note   I, Mar Daring, connected with  Lori Davidson  ([id number], Mar 05, 1966) on 03/04/23 at  9:00 AM EDT by a video-enabled telemedicine application and verified that I am speaking with the correct person using two identifiers.  Location: Patient: Virtual Visit Location  Patient: Home Provider: Virtual Visit Location Provider: Home Office   I discussed the limitations of evaluation and management by telemedicine and the availability of in person appointments. The patient expressed understanding and agreed to proceed.    History of Present Illness: Lori Davidson is a 57 y.o. who identifies as a female who was assigned female at birth, and is being seen today for possible UTI.  HPI: Urinary Tract Infection  This is a new problem. The problem occurs every urination. The problem has been gradually worsening. The quality of the pain is described as aching. The pain is mild. There has been no fever. Associated symptoms include frequency and urgency. Pertinent negatives include no chills, flank pain, hematuria, hesitancy, nausea or vomiting. Associated symptoms comments: Mild back pain. She has tried increased fluids and NSAIDs for the symptoms. The treatment provided no relief.  At home testing was positive for leuks and nitrites.    Problems:  Patient Active Problem List   Diagnosis Date Noted   GERD (gastroesophageal reflux disease) 09/13/2015   History of hyperthyroidism 09/13/2015    Allergies: No Known Allergies Medications:  Current Outpatient Medications:    cephALEXin (KEFLEX) 500 MG capsule, Take 1 capsule (500 mg total) by mouth 2 (two) times daily., Disp: 14 capsule, Rfl: 0   cetirizine (ZYRTEC) 10 MG tablet, Take 10 mg by mouth every other day. , Disp: , Rfl:    cholecalciferol (VITAMIN D) 1000 units tablet, Take 1,000 Units by mouth daily., Disp: , Rfl:  Multiple Vitamin (MULTIVITAMIN WITH MINERALS) TABS tablet, Take 1 tablet by mouth daily., Disp: , Rfl:    omeprazole (PRILOSEC OTC) 20 MG tablet, Take 20 mg by mouth 2 (two) times daily. , Disp: , Rfl:    predniSONE (DELTASONE) 10 MG tablet, Take 6 tabs on day 1, 5 tabs on day 2, 4 tabs on day 3, 3 tabs on day 4, 2 tabs on day 5, 1 tab on day 6, Disp: 21 tablet, Rfl: 0   sucralfate (CARAFATE) 1  g tablet, Take 1 tablet (1 g total) by mouth 4 (four) times daily., Disp: 120 tablet, Rfl: 0   triamcinolone cream (KENALOG) 0.1 %, Apply 1 application topically 2 (two) times daily as needed (itchy skin)., Disp: , Rfl:    zinc gluconate 50 MG tablet, Take 50 mg by mouth daily., Disp: , Rfl:   Observations/Objective: Patient is well-developed, well-nourished in no acute distress.  Resting comfortably at home.  Head is normocephalic, atraumatic.  No labored breathing.  Speech is clear and coherent with logical content.  Patient is alert and oriented at baseline.    Assessment and Plan: 1. Suspected UTI - cephALEXin (KEFLEX) 500 MG capsule; Take 1 capsule (500 mg total) by mouth 2 (two) times daily.  Dispense: 14 capsule; Refill: 0  - Worsening symptoms.  - Will treat empirically with Keflex - May use AZO for bladder spasms - Continue to push fluids.  - Seek in person evaluation for urine culture if symptoms do not improve or if they worsen.    Follow Up Instructions: I discussed the assessment and treatment plan with the patient. The patient was provided an opportunity to ask questions and all were answered. The patient agreed with the plan and demonstrated an understanding of the instructions.  A copy of instructions were sent to the patient via MyChart unless otherwise noted below.    The patient was advised to call back or seek an in-person evaluation if the symptoms worsen or if the condition fails to improve as anticipated.  Time:  I spent 8 minutes with the patient via telehealth technology discussing the above problems/concerns.    Mar Daring, PA-C

## 2023-04-13 ENCOUNTER — Ambulatory Visit
Admission: RE | Admit: 2023-04-13 | Discharge: 2023-04-13 | Disposition: A | Discharge status: Home / Self Care | Payer: 59 | Source: Ambulatory Visit

## 2023-04-13 ENCOUNTER — Other Ambulatory Visit: Payer: Self-pay | Admitting: Internal Medicine

## 2023-04-13 DIAGNOSIS — Z1231 Encounter for screening mammogram for malignant neoplasm of breast: Secondary | ICD-10-CM | POA: Diagnosis not present

## 2023-04-26 DIAGNOSIS — Z111 Encounter for screening for respiratory tuberculosis: Secondary | ICD-10-CM | POA: Diagnosis not present

## 2023-04-27 DIAGNOSIS — L570 Actinic keratosis: Secondary | ICD-10-CM | POA: Diagnosis not present

## 2023-04-27 DIAGNOSIS — L821 Other seborrheic keratosis: Secondary | ICD-10-CM | POA: Diagnosis not present

## 2023-04-27 DIAGNOSIS — L814 Other melanin hyperpigmentation: Secondary | ICD-10-CM | POA: Diagnosis not present

## 2023-04-27 DIAGNOSIS — D1801 Hemangioma of skin and subcutaneous tissue: Secondary | ICD-10-CM | POA: Diagnosis not present

## 2023-04-28 DIAGNOSIS — Z0279 Encounter for issue of other medical certificate: Secondary | ICD-10-CM | POA: Diagnosis not present

## 2023-05-18 DIAGNOSIS — Z1231 Encounter for screening mammogram for malignant neoplasm of breast: Secondary | ICD-10-CM

## 2024-01-21 IMAGING — MG MM DIGITAL SCREENING BILAT W/ TOMO AND CAD
8 series · 9 of 24 positions shown · non-contrast
Comparison: Previous exam(s).

CLINICAL DATA: Screening.

EXAM:
DIGITAL SCREENING BILATERAL MAMMOGRAM WITH TOMOSYNTHESIS AND CAD
TECHNIQUE: Bilateral screening digital craniocaudal and mediolateral oblique
mammograms were obtained. Bilateral screening digital breast
tomosynthesis was performed. The images were evaluated with
computer-aided detection.

[R CC synth-2D]
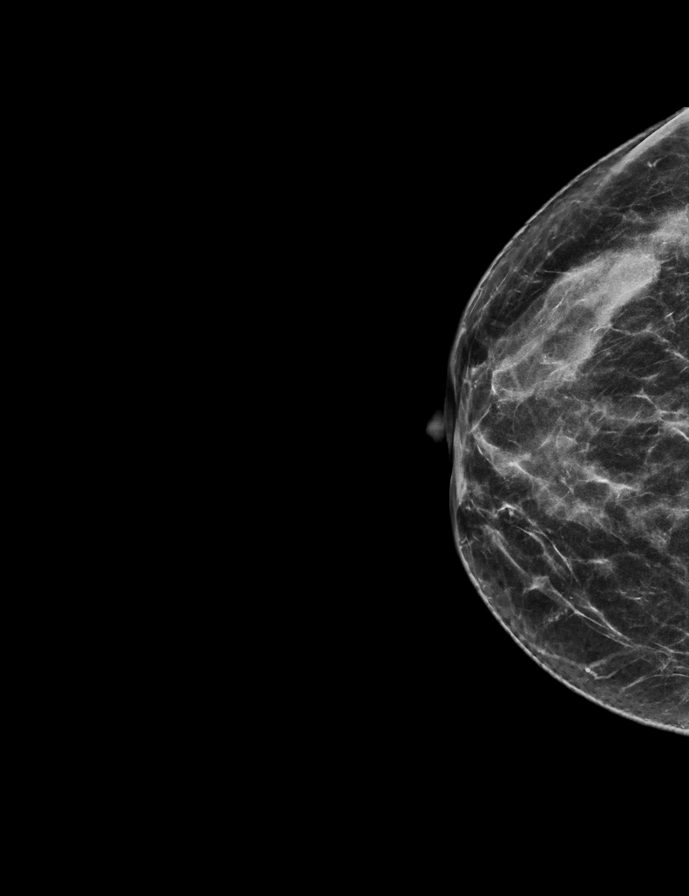

[R MLO synth-2D]
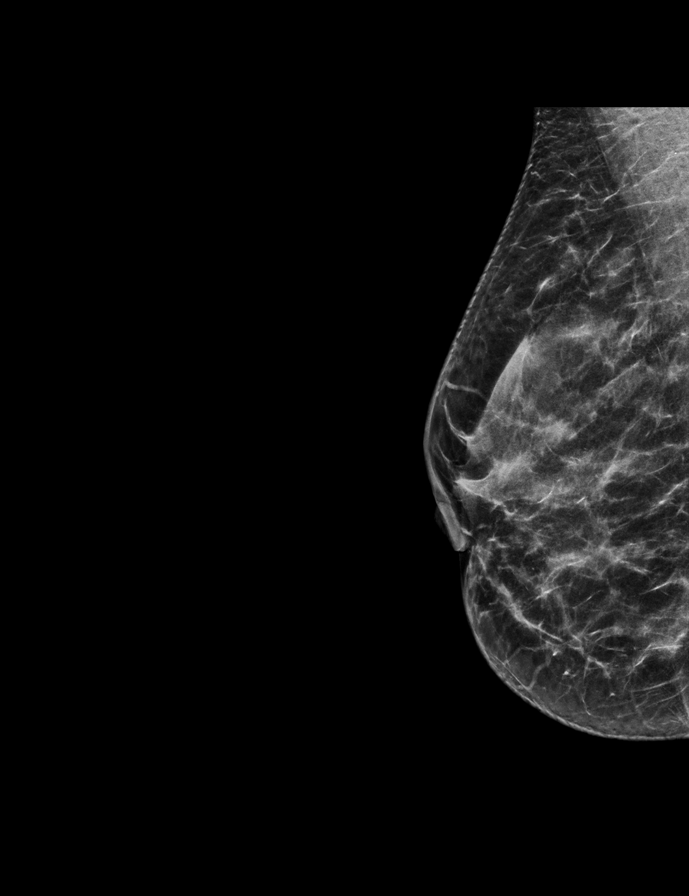

[L MLO synth-2D]
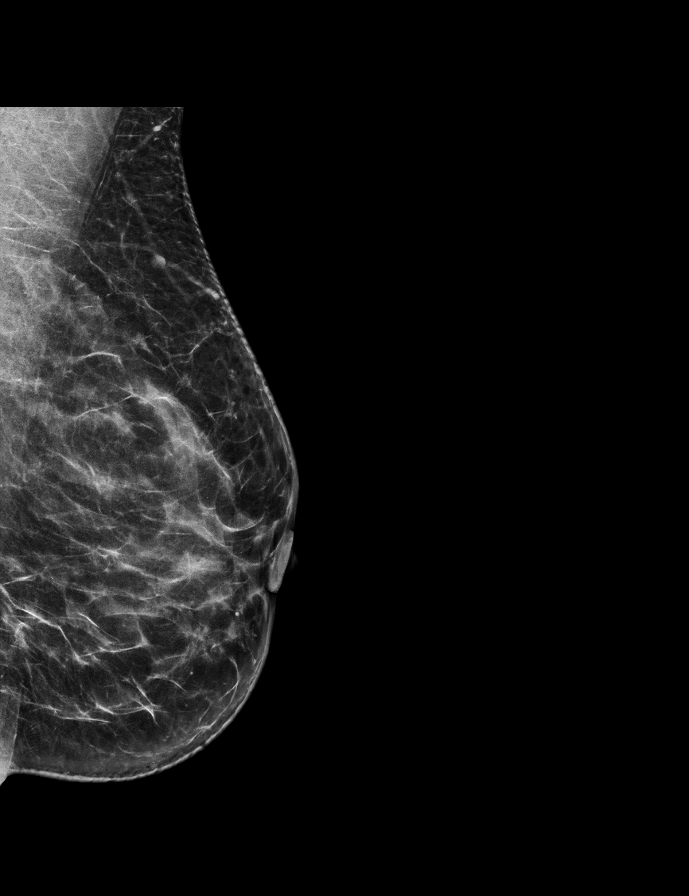

[L CC synth-2D]
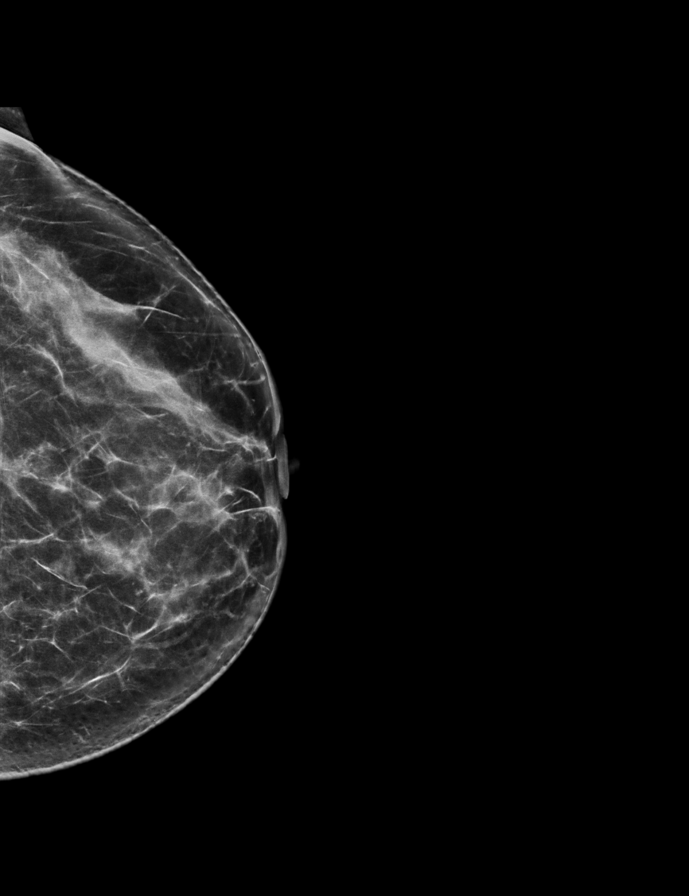

[R MLO tomo · 2 of 52 frames shown]
[frame 17/52]
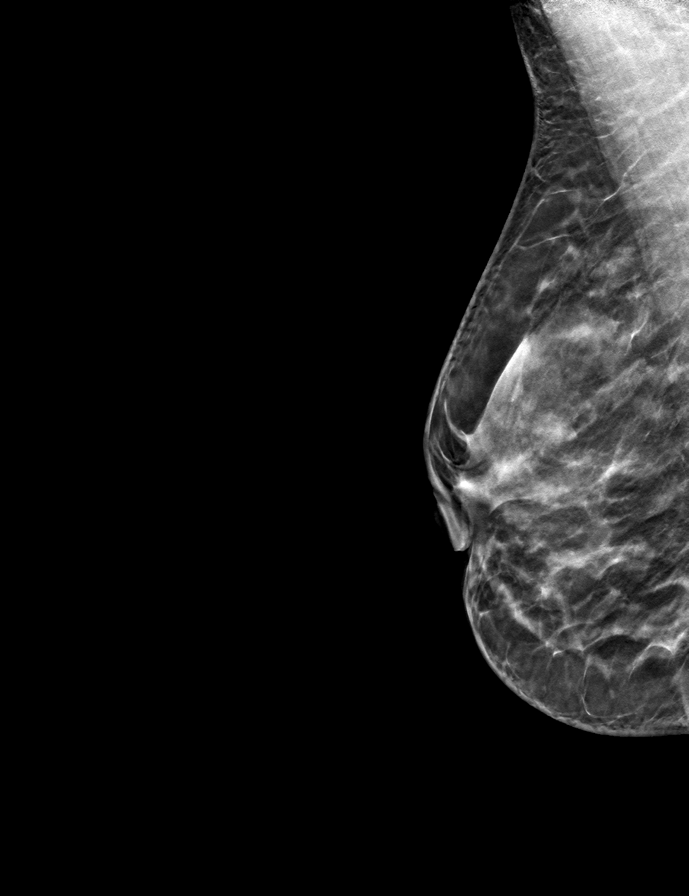
[frame 27/52]
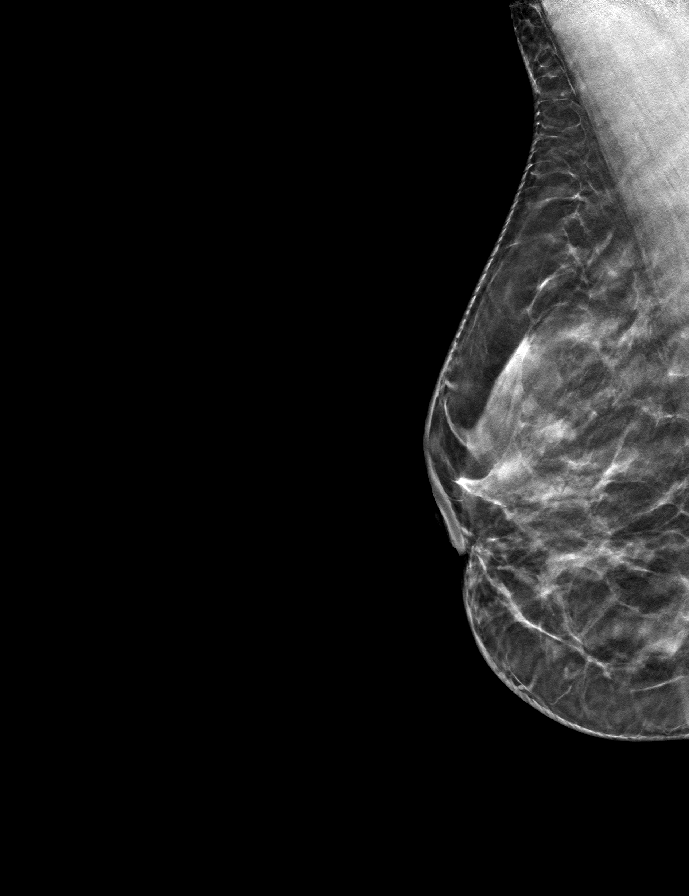

[L MLO tomo · tomo slice 31/60.0]
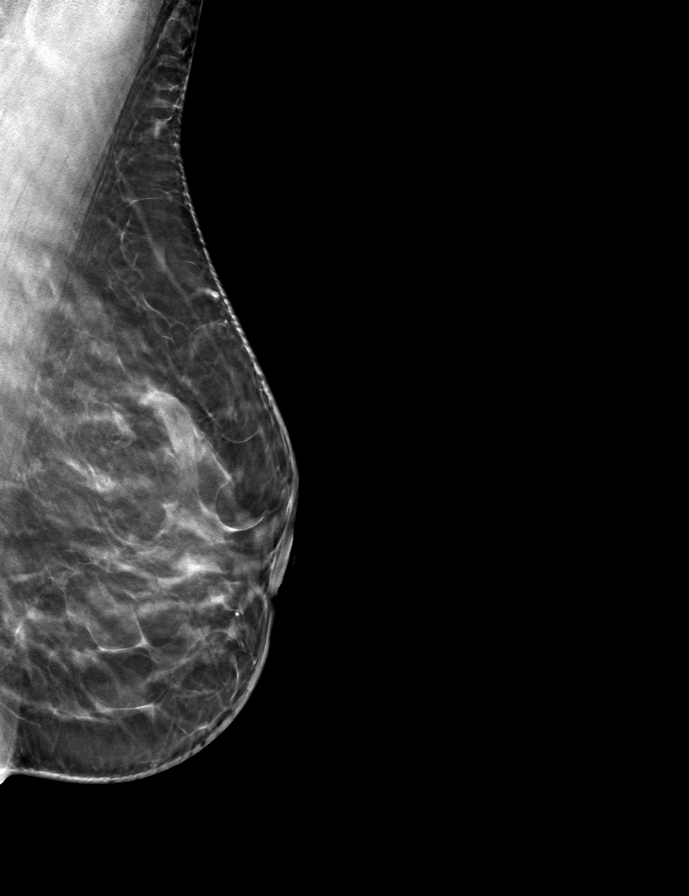

[R CC tomo · tomo slice 27/54.0]
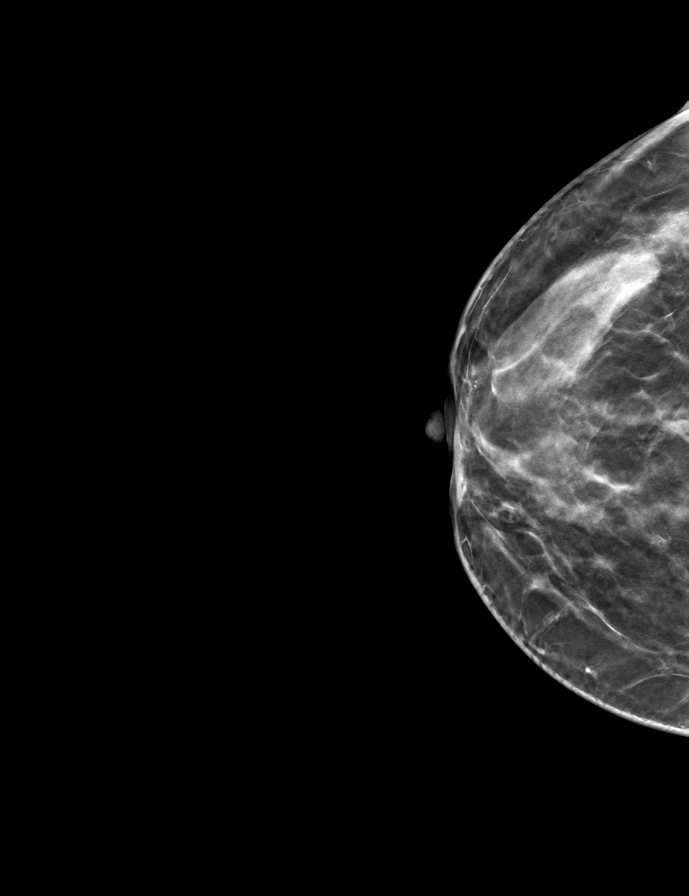

[L CC tomo · tomo slice 32/63.0]
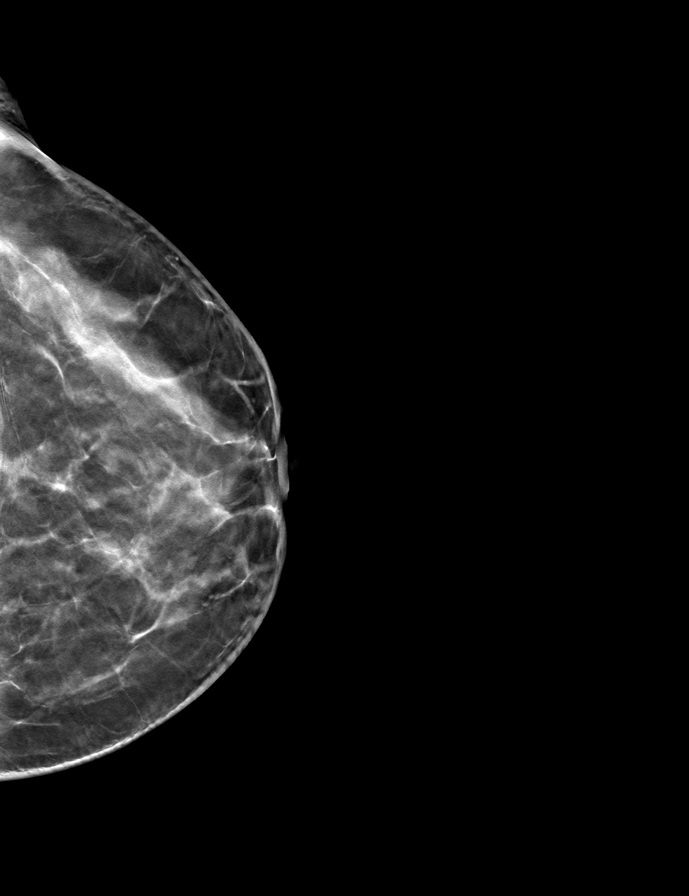

[9 of 24 positions shown; findings below may reference images not displayed]

ACR Breast Density Category c: The breast tissue is heterogeneously
dense, which may obscure small masses.
FINDINGS: There are no findings suspicious for malignancy.
IMPRESSION: No mammographic evidence of malignancy. A result letter of this
screening mammogram will be mailed directly to the patient.

RECOMMENDATION:
Screening mammogram in one year. (Code:Q3-W-BC3)

BI-RADS CATEGORY  1: Negative.

## 2024-03-28 ENCOUNTER — Other Ambulatory Visit: Payer: Self-pay | Admitting: Internal Medicine

## 2024-03-28 DIAGNOSIS — Z1231 Encounter for screening mammogram for malignant neoplasm of breast: Secondary | ICD-10-CM

## 2024-04-18 ENCOUNTER — Ambulatory Visit

## 2024-04-18 DIAGNOSIS — Z1231 Encounter for screening mammogram for malignant neoplasm of breast: Secondary | ICD-10-CM

## 2025-02-21 ENCOUNTER — Ambulatory Visit
